# Patient Record
Sex: Female | Born: 1955 | Race: White | Hispanic: No | Marital: Single | Smoking: Former smoker
Health system: Southern US, Community
[De-identification: ages and names within clinical notes are randomized; demographics above are authoritative.]

## PROBLEM LIST (undated history)

## (undated) DIAGNOSIS — R51 Headache: Secondary | ICD-10-CM

## (undated) DIAGNOSIS — J189 Pneumonia, unspecified organism: Secondary | ICD-10-CM

## (undated) DIAGNOSIS — J45909 Unspecified asthma, uncomplicated: Secondary | ICD-10-CM

## (undated) DIAGNOSIS — Z9289 Personal history of other medical treatment: Secondary | ICD-10-CM

## (undated) DIAGNOSIS — Z974 Presence of external hearing-aid: Secondary | ICD-10-CM

## (undated) HISTORY — PX: CATARACT EXTRACTION: SUR2

## (undated) HISTORY — PX: ABDOMINAL HYSTERECTOMY: SHX81

## (undated) HISTORY — DX: Headache: R51

## (undated) HISTORY — DX: Pneumonia, unspecified organism: J18.9

## (undated) HISTORY — DX: Unspecified asthma, uncomplicated: J45.909

## (undated) HISTORY — DX: Presence of external hearing-aid: Z97.4

## (undated) HISTORY — DX: Personal history of other medical treatment: Z92.89

## (undated) HISTORY — PX: OTHER SURGICAL HISTORY: SHX169

---

## 1998-03-20 ENCOUNTER — Emergency Department (HOSPITAL_COMMUNITY): Admission: EM | Admit: 1998-03-20 | Discharge: 1998-03-20 | Payer: Self-pay

## 2000-04-01 ENCOUNTER — Ambulatory Visit (HOSPITAL_COMMUNITY): Admission: RE | Admit: 2000-04-01 | Discharge: 2000-04-01 | Payer: Self-pay | Admitting: *Deleted

## 2000-04-01 ENCOUNTER — Encounter: Payer: Self-pay | Admitting: *Deleted

## 2000-06-08 ENCOUNTER — Inpatient Hospital Stay (HOSPITAL_COMMUNITY): Admission: AD | Admit: 2000-06-08 | Discharge: 2000-06-08 | Payer: Self-pay | Admitting: Obstetrics

## 2000-06-09 ENCOUNTER — Inpatient Hospital Stay (HOSPITAL_COMMUNITY): Admission: AD | Admit: 2000-06-09 | Discharge: 2000-06-09 | Payer: Self-pay | Admitting: *Deleted

## 2000-06-28 ENCOUNTER — Inpatient Hospital Stay (HOSPITAL_COMMUNITY): Admission: AD | Admit: 2000-06-28 | Discharge: 2000-06-28 | Payer: Self-pay | Admitting: *Deleted

## 2000-07-24 ENCOUNTER — Encounter (INDEPENDENT_AMBULATORY_CARE_PROVIDER_SITE_OTHER): Payer: Self-pay | Admitting: Specialist

## 2000-07-24 ENCOUNTER — Inpatient Hospital Stay (HOSPITAL_COMMUNITY): Admission: AD | Admit: 2000-07-24 | Discharge: 2000-07-26 | Payer: Self-pay | Admitting: *Deleted

## 2001-05-17 ENCOUNTER — Emergency Department (HOSPITAL_COMMUNITY): Admission: EM | Admit: 2001-05-17 | Discharge: 2001-05-17 | Payer: Self-pay | Admitting: Emergency Medicine

## 2001-05-17 ENCOUNTER — Encounter: Payer: Self-pay | Admitting: Emergency Medicine

## 2006-08-09 DIAGNOSIS — Z9289 Personal history of other medical treatment: Secondary | ICD-10-CM

## 2006-08-09 HISTORY — DX: Personal history of other medical treatment: Z92.89

## 2006-12-22 ENCOUNTER — Ambulatory Visit: Payer: Self-pay | Admitting: Nurse Practitioner

## 2007-01-11 ENCOUNTER — Encounter (HOSPITAL_COMMUNITY): Admission: RE | Admit: 2007-01-11 | Discharge: 2007-03-20 | Payer: Self-pay | Admitting: Family Medicine

## 2007-01-26 ENCOUNTER — Ambulatory Visit: Payer: Self-pay | Admitting: Family Medicine

## 2007-01-27 ENCOUNTER — Inpatient Hospital Stay (HOSPITAL_COMMUNITY): Admission: AD | Admit: 2007-01-27 | Discharge: 2007-01-27 | Payer: Self-pay | Admitting: Gynecology

## 2007-03-01 ENCOUNTER — Encounter: Payer: Self-pay | Admitting: Obstetrics & Gynecology

## 2007-03-01 ENCOUNTER — Ambulatory Visit: Payer: Self-pay | Admitting: Obstetrics & Gynecology

## 2007-03-20 ENCOUNTER — Ambulatory Visit: Payer: Self-pay | Admitting: Obstetrics & Gynecology

## 2007-03-20 ENCOUNTER — Inpatient Hospital Stay (HOSPITAL_COMMUNITY): Admission: RE | Admit: 2007-03-20 | Discharge: 2007-03-23 | Payer: Self-pay | Admitting: Obstetrics & Gynecology

## 2007-03-20 ENCOUNTER — Encounter: Payer: Self-pay | Admitting: Obstetrics & Gynecology

## 2007-04-28 ENCOUNTER — Ambulatory Visit: Payer: Self-pay | Admitting: Obstetrics & Gynecology

## 2007-05-12 ENCOUNTER — Ambulatory Visit: Payer: Self-pay | Admitting: Obstetrics & Gynecology

## 2009-04-12 ENCOUNTER — Emergency Department (HOSPITAL_COMMUNITY): Admission: EM | Admit: 2009-04-12 | Discharge: 2009-04-12 | Payer: Self-pay | Admitting: Emergency Medicine

## 2010-08-30 ENCOUNTER — Encounter: Payer: Self-pay | Admitting: Family Medicine

## 2010-11-13 LAB — URINALYSIS, ROUTINE W REFLEX MICROSCOPIC
Ketones, ur: NEGATIVE mg/dL
Leukocytes, UA: NEGATIVE
Nitrite: NEGATIVE
Urobilinogen, UA: 1 mg/dL (ref 0.0–1.0)
pH: 7.5 (ref 5.0–8.0)

## 2010-11-13 LAB — URINE MICROSCOPIC-ADD ON

## 2010-12-22 NOTE — Op Note (Signed)
NAMEVINCENZINA, [name]                ACCOUNT NO.:  [id number]   MEDICAL RECORD NO.:  [id number]          PATIENT TYPE:  INP   LOCATION:  9302                          [facility]   PHYSICIAN:  Allie Bossier, MD        DATE OF BIRTH:  11-09-1955   DATE OF PROCEDURE:  03/20/2007  DATE OF DISCHARGE:                               OPERATIVE REPORT   PREOPERATIVE DIAGNOSIS:  Uterine fibroids and anemia.   POSTOPERATIVE DIAGNOSIS:  Uterine fibroids and anemia.   PROCEDURE:  Total abdominal hysterectomy, bilateral salpingo-  oophorectomy.   SURGEON:  Allie Bossier, M.D.   ANESTHESIA:  Oda Cogan, M.D.   COMPLICATIONS:  None.   ESTIMATED BLOOD LOSS:  400 mL.   SPECIMEN:  Uterus, tubes and ovaries.   PROCEDURE AND FINDINGS:  In the preop area the risks, benefits,  alternatives of surgery explained, understood and accepted by patient  and sisters. Consents were signed. In the operating room.  General  anesthesia was applied without complication.  Her abdomen and vagina  were prepped and draped usual sterile fashion.  A Foley catheter drained  clear urine throughout the case. A transverse incision was made  approximately 2 cm above the symphysis pubis.  Incision was carried down  through the subcutaneous tissue and the fascia.  Bleeding was cauterized  with a Bovie.  Fascial incision was extended bilaterally.  The rectus  muscles were partially separated in transverse fashion using  electrosurgical technique.  The peritoneum was entered with hemostats;  peritoneal incision was extended bilaterally taking care to avoid the  bladder.  The bowel was packed up into the abdominal cavity with a moist  sponge. Abdominal sidewalls were lined with the moist sponges and an  O'Connor-O'Sullivan self-retaining retractor was placed. The enlarged  uterus was elevated with Kocher clamps on the utero-ovarian ligaments.  The round ligaments were clamped, cut and ligated and then the utero-  ovarian  ligaments were clamped and ligated. 2-0 Vicryl sutures used  throughout this case unless otherwise specified.  A bladder flap was  created anteriorly.  The bladder was pushed out of the operative site.  The uterine vessels were skeletonized, clamped, cut and doubly ligated.  By lifting the uterus out of the incisions we were able to visualize all  the anatomy and remove the cervix and uterus together with a clamp, cut  and ligate technique.  The cervix was removed and noted to be intact.  The vaginal cuff was closed with angle sutures of 0 Vicryl and then  running locking closure of the vaginal cuff.  Excellent hemostasis was  noted.  The pelvis was irrigated.  The ureters were visualized.  They  were noted to be of normal caliber and functioning normally.  The  infundibulopelvic ligaments were identified, clamped, cut and doubly  ligated.  Excellent hemostasis was noted of all pedicles.  The sponges  and retractor were removed.  The fascia was closed with 0 Vicryl running  nonlocking suture and no defects were palpable.  The subcutaneous tissue  was irrigated cleaned and dried. It  was then infiltrated with 30 mL of 0.5% Marcaine.  A subcuticular  closure was done with 3-0 Vicryl, subcutaneous tissue was irrigated  prior to closure. Her Foley catheter drained clear urine throughout.  She was extubated and taken to recovery in stable condition with  instrument, sponge and needle counts correct.      Allie Bossier, MD  Electronically Signed     MCD/MEDQ  D:  03/20/2007  T:  03/20/2007  Job:  [id number]

## 2010-12-22 NOTE — Group Therapy Note (Signed)
[name], [name]                ACCOUNT NO.:  [id number]   MEDICAL RECORD NO.:  [id number]          PATIENT TYPE:  WOC   LOCATION:  [facility]                   FAC[facility]:  [facility]   PHYSICIAN:  Elsie Lincoln, MD      DATE OF BIRTH:  1955-12-27   DATE OF SERVICE:                                  CLINIC NOTE   REASON FOR VISIT/CHIEF COMPLAINT:  The patient is here for a  postoperative checkup for an abdominal hysterectomy, bilateral salpingo-  oophorectomy done for uterine fibroids and anemia on March 20, 2007.  It has been almost 6 weeks, but not quite, since her surgery.  She  denies any bleeding, no problems urinating, a slight bit of  constipation.  Denies any pain.  She has been using ibuprofen, but not  for abdominal pain, she says just when she has a pain somewhere else.  Her pathology results were completely benign, just showed leiomyomata.   PHYSICAL EXAMINATION:  Temperature was 97.7, pulse 60, blood pressure  134/80, weight 144.7 pounds.  ABDOMEN:  Soft, nondistended, nontender.  Scar transverse and well  healed from her total abdominal hysterectomy.  GENITAL EXAM:  Revealed normal labia, normal vaginal introitus, normal  vaginal walls.  A small amount of yellowish-whitish discharge along  suture lines from the hysterectomy inside of her vagina.  Also, some  friable, erythematous areas near the incision line.  No malodorous  discharge.  Bimanual exam showed no masses or adnexal tenderness, no  cervical motion tenderness, or tenderness where her cervix used to be.   ASSESSMENT AND PLAN:  The patient is a woman in her 18s, status post  hysterectomy and oophorectomy for fibroids and anemia, benign pathology.  The patient will follow up in 2 weeks to recheck her vaginal area due to  the friableness and the slight bit of discharge.  It appears that her  hysterectomy site has not healed completely.  I advised the patient to  not have anything per vagina, no douches, no  intercourse for the next 2  weeks until we examine her again.  She may go back to work, however.  Also gave her a prescription for Colace, she can get this over the  counter 100 mg 1 tablet p.o. daily for her constipation.  Dr. Penne Lash  did examine the patient's vagina, did the pelvic exam with me and  everything she mentioned was stated in the physical exam portion of this  dictation.     ______________________________  Elsie Lincoln, MD    ______________________________  Elsie Lincoln, MD    KL/MEDQ  D:  04/28/2007  T:  04/28/2007  Job:  [id number]

## 2010-12-22 NOTE — Discharge Summary (Signed)
[name], [name]                ACCOUNT NO.:  [id number]   MEDICAL RECORD NO.:  [id number]          PATIENT TYPE:  INP   LOCATION:  9302                          [facility]   PHYSICIAN:  Allie Bossier, MD        DATE OF BIRTH:  11-19-1955   DATE OF ADMISSION:  03/20/2007  DATE OF DISCHARGE:  03/23/2007                               DISCHARGE SUMMARY   DISPOSITION:  Home.  Diet as tolerated.  Activity would be pelvic rest,  please continue medications of Darvocet N 100 1 p.o. q.4h p.r.n. pain   PROCEDURE:  During hospital stay was a total abdominal hysterectomy,  bilateral salpingo-oophorectomy and a transfusion of 2 units of packed  red blood cells.   HISTORY OF PRESENT ILLNESS AND HOSPITAL COURSE:  [name] is a 55 year old  single white gravida 1, para 1 who was seen as a referral from  [facility] for anemia and dysfunctional uterine bleeding.  An  ultrasound had been done which showed an 8 cm fibroid with a submucosal  component.  Preoperatively I rechecked her hemoglobin and hematocrit and  it was 11.6 and 35.3.  Therefore I did not transfuse her preoperatively.  She had an uncomplicated surgery an EBL of approximately 400.  However  on postoperative day #1 her hemoglobin was 7.7 and I transfused her 2  units of packed red blood cells.  Follow-up hemoglobin on postop day 2  was 8.9 and it was stable on the day of discharge at 8.7.  Postoperatively she did very well.  She was voiding, ambulating and  tolerating p.o. intake without difficulty.  She remained afebrile  throughout her hospital course and was ready to go home by the day of  discharge.  Pathology results showed benign proliferative endometrium  submucosal and subserosal fibroids and normal ovaries.      Allie Bossier, MD  Electronically Signed     MCD/MEDQ  D:  04/21/2007  T:  04/22/2007  Job:  [id number]

## 2010-12-22 NOTE — Group Therapy Note (Signed)
NAME:  [name], HOLIK NO.:  [id number]   MEDICAL RECORD NO.:  [id number]          PATIENT TYPE:  WOC   LOCATION:  [facility]                   [facility]   PHYSICIAN:  Allie Bossier, MD        DATE OF BIRTH:  [birth year]   DATE OF SERVICE:                                  CLINIC NOTE   Azalynn is a 55 year old single white gravida 1, para 1 with a 6-1/2 year  old daughter named Publishing rights manager.  She comes here for her annual exam.  Her  last Pap smear was done approximately 6 years ago and she has never had  a mammogram.  Her major complaint is that of heavy vaginal bleeding.  She was in fact, seen at the [facility] January 27, 2007 for heavy vaginal  bleeding.  Actually, the day before at [facility] her hemoglobin  was  noted to be 7.3.  An ultrasound was done which showed an 8 cm fibroid  centrally located with a submucosal component.  Her ovaries appeared  normal and her uterine lining was not visualized.   PAST MEDICAL HISTORY:  Bilateral cataracts, anemia, fibroid and  menorrhagia.   PAST SURGICAL HISTORY:  Tubal ligation and right cataract removal.   No known drug allergies.   MEDICATIONS:  She takes iron daily and ibuprofen as necessary.   SOCIAL HISTORY:  Negative for alcohol.  She was a previous social smoker  but quit entirely 4 years ago.  No known drug allergies, no latex  allergies.   FAMILY HISTORY:  Significant for breast cancer in her mother in her 58s.   PHYSICAL EXAMINATION:  Weight 140 pounds.  Height 5 feet 10 inches.  Blood pressure 145/89, pulse 63, normal temperature.  HEENT:  Normal with the exception of poor dentition and apparently a  left cataract.  BREAST:  Exam normal.  ABDOMINAL:  Her uterus is palpable through approximately 4 cm below the  umbilicus.  LUNGS:  Clear to auscultation bilaterally.  HEART:  Regular rate and rhythm.  BACK:  Does seem to demonstrate some osteoporotic changes and she is  certainly thin.  EXTERNAL GENITALIA:   Mild atrophy.  Normal cervix and her uterus is 14  to 16 weeks size.   ASSESSMENT AND PLAN:  Annual exam.  I have checked a Pap smear.  With  regard to her fibroid and anemia I will also check a TSH to make sure  this is not an issue.  With regard to her fibroid I have counseled her  regarding  options.  We have discussed Lupron which is financially not an option  versus a hysterectomy and removal of her ovaries.  She is amendable to  this and I will schedule it.  I will most likely transfuse her prior to  surgery.      Allie Bossier, MD     MCD/MEDQ  D:  03/01/2007  T:  03/02/2007  Job:  [id number]

## 2010-12-25 NOTE — Op Note (Signed)
[facility]  Patient:    [name], [name]                       MRN: [id number] Proc. Date: 07/25/00 Adm. Date:  [id number] Attending:  Michaelle Copas Dictator:   Jamey Reas, M.D.                           Operative Report  DATE OF BIRTH:                05-22-1956  PREOPERATIVE DIAGNOSES:       Advanced maternal age, postpartum, desires permanent sterilization.  POSTOPERATIVE DIAGNOSES:      Advanced maternal age, postpartum, desires permanent sterilization.  PROCEDURE:                    Postpartum tubal ligation, Pomeroy method.  SURGEON:                      Roseanna Rainbow, M.D.  ASSIST:                       Jamey Reas, M.D.  ANESTHESIA:                   Epidural.  COMPLICATIONS:                None.  ESTIMATED BLOOD LOSS:         Minimal.  FLUIDS:                       Lactated Ringers 200 cc.  INDICATIONS:                  A 55 year old G1, P1-0-0-1 status post NSVD who desires permanent sterilization.  Risks and benefits of procedure discussed with patient including risk failure of 3-5 per 1000 with increased risk of ectopic gestation if pregnancy occurs.  Also discussed that patient is getting towards the end of her ovulatory life and she desires to persist on for tubal ligation.  FINDINGS:                     Normal tubes and ovaries.  Patient did have evidence of fibroids on examination of the uterus.  PROCEDURE:                    The patient was taken to the operating room where epidural was found to be adequate.  A small transverse infraumbilical skin incision was then made with the scalpel.  The incision was carried down to the underlying fascia until the peritoneum was identified and entered.  The peritoneum was noted to be free of any adhesions and the incision was then extended with Metzenbaum scissors.  The patients right fallopian tube was then identified, brought to the  incision, and grasped with Babcock clamp.  The tube was then followed out to the fimbria.  Babcock clamp was then used to grasp the tube approximately 4 cm from the corneal region.  A 3 cm segment of tube was then ligated with a free tie of plain gut and a second plain gut tie was placed.  The tube was then excised.  Good hemostasis was noted and the tube was returned to the abdomen.  The left fallopian tube was then ligated and a 3  cm segment excised in a similar fashion.  Excellent hemostasis was noted and the tube returned to the abdomen.  Peritoneum and fascia were then closed in a single layer using 3-0 Vicryl suture.  The skin was closed in a subcuticular fashion with 4-0 Vicryl.  Patient tolerated procedure well. Sponge, lap, and needle counts were correct x 2.  Patient was taken to the recovery room in stable condition.  PATHOLOGY:                    Segments of right and left fallopian tubes.DD: 07/25/00 TD:  07/25/00 Job: [id number]

## 2011-02-21 ENCOUNTER — Emergency Department (HOSPITAL_COMMUNITY)
Admission: EM | Admit: 2011-02-21 | Discharge: 2011-02-21 | Disposition: A | Discharge status: Home / Self Care | Payer: 59 | Attending: Emergency Medicine | Admitting: Emergency Medicine

## 2011-02-21 DIAGNOSIS — L2989 Other pruritus: Secondary | ICD-10-CM | POA: Insufficient documentation

## 2011-02-21 DIAGNOSIS — L298 Other pruritus: Secondary | ICD-10-CM | POA: Insufficient documentation

## 2011-02-21 DIAGNOSIS — L259 Unspecified contact dermatitis, unspecified cause: Secondary | ICD-10-CM | POA: Insufficient documentation

## 2011-03-29 ENCOUNTER — Emergency Department (HOSPITAL_COMMUNITY)
Admission: EM | Admit: 2011-03-29 | Discharge: 2011-03-29 | Disposition: A | Discharge status: Home / Self Care | Payer: 59 | Attending: Emergency Medicine | Admitting: Emergency Medicine

## 2011-03-29 DIAGNOSIS — M79609 Pain in unspecified limb: Secondary | ICD-10-CM | POA: Insufficient documentation

## 2011-03-29 DIAGNOSIS — L02419 Cutaneous abscess of limb, unspecified: Secondary | ICD-10-CM | POA: Insufficient documentation

## 2011-03-31 ENCOUNTER — Emergency Department (HOSPITAL_COMMUNITY)
Admission: EM | Admit: 2011-03-31 | Discharge: 2011-03-31 | Disposition: A | Discharge status: Home / Self Care | Payer: 59 | Attending: Emergency Medicine | Admitting: Emergency Medicine

## 2011-03-31 DIAGNOSIS — Z4800 Encounter for change or removal of nonsurgical wound dressing: Secondary | ICD-10-CM | POA: Insufficient documentation

## 2011-03-31 DIAGNOSIS — Z09 Encounter for follow-up examination after completed treatment for conditions other than malignant neoplasm: Secondary | ICD-10-CM | POA: Insufficient documentation

## 2011-03-31 DIAGNOSIS — L02419 Cutaneous abscess of limb, unspecified: Secondary | ICD-10-CM | POA: Insufficient documentation

## 2011-03-31 DIAGNOSIS — L03119 Cellulitis of unspecified part of limb: Secondary | ICD-10-CM | POA: Insufficient documentation

## 2011-04-02 ENCOUNTER — Emergency Department (HOSPITAL_COMMUNITY)
Admission: EM | Admit: 2011-04-02 | Discharge: 2011-04-02 | Disposition: A | Discharge status: Home / Self Care | Payer: 59 | Attending: Emergency Medicine | Admitting: Emergency Medicine

## 2011-04-02 DIAGNOSIS — Z09 Encounter for follow-up examination after completed treatment for conditions other than malignant neoplasm: Secondary | ICD-10-CM | POA: Insufficient documentation

## 2011-04-02 DIAGNOSIS — L02419 Cutaneous abscess of limb, unspecified: Secondary | ICD-10-CM | POA: Insufficient documentation

## 2011-05-24 LAB — CROSSMATCH

## 2011-05-24 LAB — CBC
HCT: 22.4 — ABNORMAL LOW
HCT: 26.2 — ABNORMAL LOW
HCT: 26.6 — ABNORMAL LOW
HCT: 35.3 — ABNORMAL LOW
Hemoglobin: 11.6 — ABNORMAL LOW
Hemoglobin: 7.7 — CL
MCHC: 32.8
MCHC: 33.5
MCV: 84.3
MCV: 85.3
MCV: 85.9
MCV: 87.4
Platelets: 187
Platelets: 187
Platelets: 240
RBC: 3.1 — ABNORMAL LOW
RBC: 4.14
RDW: 26.4 — ABNORMAL HIGH
RDW: 28.4 — ABNORMAL HIGH
WBC: 5.7

## 2011-05-24 LAB — BASIC METABOLIC PANEL
CO2: 30
Chloride: 102
Creatinine, Ser: 0.51
GFR calc Af Amer: >60
Potassium: 3.7
Sodium: 138

## 2011-05-26 LAB — WET PREP, GENITAL: Yeast Wet Prep HPF POC: NONE SEEN

## 2011-05-27 LAB — CROSSMATCH
ABO/RH(D): A POS
Antibody Screen: NEGATIVE

## 2011-05-27 LAB — ABO/RH: ABO/RH(D): A POS

## 2012-01-28 ENCOUNTER — Emergency Department (HOSPITAL_COMMUNITY)
Admission: EM | Admit: 2012-01-28 | Discharge: 2012-01-28 | Disposition: A | Discharge status: Home / Self Care | Payer: 59 | Attending: Emergency Medicine | Admitting: Emergency Medicine

## 2012-01-28 ENCOUNTER — Encounter (HOSPITAL_COMMUNITY): Payer: Self-pay | Admitting: Emergency Medicine

## 2012-01-28 DIAGNOSIS — M25519 Pain in unspecified shoulder: Secondary | ICD-10-CM | POA: Insufficient documentation

## 2012-01-28 MED ORDER — ACETAMINOPHEN-CODEINE #3 300-30 MG PO TABS: 1.0000 | ORAL_TABLET | Freq: Four times a day (QID) | ORAL | Status: AC | PRN

## 2012-01-28 MED ORDER — NAPROXEN 500 MG PO TABS: 500.0000 mg | ORAL_TABLET | Freq: Two times a day (BID) | ORAL | Status: DC

## 2012-01-28 NOTE — ED Provider Notes (Signed)
History     CSN: [id number]  Arrival date & time 01/28/12  1025   First MD Initiated Contact with Patient 01/28/12 1128      Chief Complaint  Patient presents with  . Shoulder Pain    (Consider location/radiation/quality/duration/timing/severity/associated sxs/prior treatment) Patient is a 56 y.o. female presenting with shoulder pain. The history is provided by the patient.  Shoulder Pain  Patient presents to ER complaining of a 2 week hx of intermittent left shoulder pain stating that a couple of weeks ago she noticed pain in shoulder with movement that she took aleve for and it went away but then once again a few days ago started noticing pain in left shoulder with movement and has been taking both aleve and ibuprofen for pain but with ongoing pain. Notes some temporary improvement in pain but then recurrence. 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 She states she noticed some swelling in her hand a few days ago but then the swelling has completely resolved. Denies any other skin changes or redness of joints. Does not have a PCP and takes no meds on regular basis.   History reviewed. No pertinent past medical history.  History reviewed. No pertinent past surgical history.  History reviewed. No pertinent family history.  History  Substance Use Topics  . Smoking status: Never Smoker   . Smokeless tobacco: Not on file  . Alcohol Use: Yes     occasional    OB History    Grav Para Term Preterm Abortions TAB SAB Ect Mult Living                  Review of Systems  All other systems reviewed and are negative.    Allergies  Zithromax  Home Medications   Current Outpatient Rx  Name Route Sig Dispense Refill  . IBUPROFEN 200 MG PO TABS Oral Take 600 mg by mouth every 6 (six) hours as needed. For pain    . NAPROXEN SODIUM 220 MG PO TABS Oral Take 440 mg by mouth 2 (two) times daily as needed. For pain       BP 142/83  Pulse 61  Temp 98.1 F (36.7 C) (Oral)  Resp 18  SpO2 100%  Physical Exam  Constitutional: She is oriented to person, place, and time. She appears well-developed and well-nourished. No distress.  HENT:  Head: Normocephalic and atraumatic.  Eyes: Conjunctivae are normal.  Cardiovascular: Normal rate, regular rhythm, normal heart sounds and intact distal pulses.  Exam reveals no gallop and no friction rub.   No murmur heard. Pulmonary/Chest: Effort normal and breath sounds normal. No respiratory distress. She has no wheezes. She has no rales. She exhibits no tenderness.  Musculoskeletal: She exhibits tenderness. She exhibits no edema.       Right ankle: She exhibits swelling. tenderness.       TTP of left humeral head and decreased ROM due to pain. No shoulder deformity, redness, skin changes or heat. No TTP of posterior shoulder. Pain with abductions and rotation. Good radial pulse and sensation of entire LUE.  Neurological: She is alert and oriented to person, place, and time.       Normal sensation of entire foot.   Skin: Skin is warm and dry. No rash noted. She is not diaphoretic. No erythema. No pallor.  Psychiatric: She has a normal mood and affect. Her behavior is normal.    ED Course  Procedures (including critical care time)  Patient drove self to ER and took ibuprofen this morning.   Labs Reviewed - No data to display No results found.   1. Shoulder pain       MDM  Left shoulder pain with no evidence of septic joint or infection. LUE is neurovasc intact. No known injury but repetitive movement for work likely MSK pain/inflammation. Will treat with NSAIDs, ice and pain medication and give ortho follow up.   There is no swelling or skin changes to entire LUE.         [place], [place] 01/28/12 1145

## 2012-01-28 NOTE — ED Notes (Signed)
Pt c/o left shoulder pain x several weeks with swelling to 2nd digit on left hand; pt denies obvious injury; CMS intact

## 2012-01-28 NOTE — ED Provider Notes (Signed)
Medical screening examination/treatment/procedure(s) were performed by non-physician practitioner and as supervising physician I was immediately available for consultation/collaboration.  Barbar Brede, MD 01/28/12 1426 

## 2012-01-28 NOTE — Discharge Instructions (Signed)
Take naprosyn as directed for inflammation and pain using tylenol #3 for breakthrough pain but do not drive or operate machinery with tylenol #3 use. Ice shoulder in the mornings and evenings for additional pain relief. Follow up with orthopedic specialist in 1-2 weeks for recheck of ongoing shoulder pain but return to ER for any emergent changing or worsening of symptoms. Establishment with a Primary Care provider is Very important for general health care concerns, minor illness and minor injury. See the attached referral to primary care provider.    Arthralgia Your caregiver has diagnosed you as suffering from an arthralgia. Arthralgia means there is pain in a joint. This can come from many reasons including:  Bruising the joint which causes soreness (inflammation) in the joint.   Wear and tear on the joints which occur as we grow older (osteoarthritis).   Overusing the joint.   Various forms of arthritis.   Infections of the joint.  Regardless of the cause of pain in your joint, most of these different pains respond to anti-inflammatory drugs and rest. The exception to this is when a joint is infected, and these cases are treated with antibiotics, if it is a bacterial infection. HOME CARE INSTRUCTIONS   Rest the injured area for as long as directed by your caregiver. Then slowly start using the joint as directed by your caregiver and as the pain allows. Crutches as directed may be useful if the ankles, knees or hips are involved. If the knee was splinted or casted, continue use and care as directed. If an stretchy or elastic wrapping bandage has been applied today, it should be removed and re-applied every 3 to 4 hours. It should not be applied tightly, but firmly enough to keep swelling down. Watch toes and feet for swelling, bluish discoloration, coldness, numbness or excessive pain. If any of these problems (symptoms) occur, remove the ace bandage and re-apply more loosely. If these symptoms  persist, contact your caregiver or return to this location.   For the first 24 hours, keep the injured extremity elevated on pillows while lying down.   Apply ice for 15 to 20 minutes to the sore joint every couple hours while awake for the first half day. Then 3 to 4 times per day for the first 48 hours. Put the ice in a plastic bag and place a towel between the bag of ice and your skin.   Wear any splinting, casting, elastic bandage applications, or slings as instructed.   Only take over-the-counter or prescription medicines for pain, discomfort, or fever as directed by your caregiver. Do not use aspirin immediately after the injury unless instructed by your physician. Aspirin can cause increased bleeding and bruising of the tissues.   If you were given crutches, continue to use them as instructed and do not resume weight bearing on the sore joint until instructed.  Persistent pain and inability to use the sore joint as directed for more than 2 to 3 days are warning signs indicating that you should see a caregiver for a follow-up visit as soon as possible. Initially, a hairline fracture (break in bone) may not be evident on X-rays. Persistent pain and swelling indicate that further evaluation, non-weight bearing or use of the joint (use of crutches or slings as instructed), or further X-rays are indicated. X-rays may sometimes not show a small fracture until a week or 10 days later. Make a follow-up appointment with your own caregiver or one to whom we have referred you. A  radiologist (specialist in reading X-rays) may read your X-rays. Make sure you know how you are to obtain your X-ray results. Do not assume everything is normal if you do not hear from [place]. SEEK MEDICAL CARE IF: Bruising, swelling, or pain increases. SEEK IMMEDIATE MEDICAL CARE IF:   Your fingers or toes are numb or blue.   The pain is not responding to medications and continues to stay the same or get worse.   The pain in  your joint becomes severe.   You develop a fever over 102 F (38.9 C).   It becomes impossible to move or use the joint.  MAKE SURE YOU:   Understand these instructions.   Will watch your condition.   Will get help right away if you are not doing well or get worse.  Document Released: 07/26/2005 Document Revised: 07/15/2011 Document Reviewed: 03/13/2008 [facility] Patient Information 2012 [place], [place].

## 2012-07-09 DIAGNOSIS — J189 Pneumonia, unspecified organism: Secondary | ICD-10-CM

## 2012-07-09 HISTORY — DX: Pneumonia, unspecified organism: J18.9

## 2012-08-05 ENCOUNTER — Encounter (HOSPITAL_COMMUNITY): Payer: Self-pay | Admitting: *Deleted

## 2012-08-05 ENCOUNTER — Emergency Department (HOSPITAL_COMMUNITY)
Admission: EM | Admit: 2012-08-05 | Discharge: 2012-08-05 | Disposition: A | Discharge status: Home / Self Care | Payer: 59 | Attending: Emergency Medicine | Admitting: Emergency Medicine

## 2012-08-05 ENCOUNTER — Other Ambulatory Visit: Payer: Self-pay

## 2012-08-05 ENCOUNTER — Emergency Department (HOSPITAL_COMMUNITY): Payer: 59

## 2012-08-05 DIAGNOSIS — I4891 Unspecified atrial fibrillation: Secondary | ICD-10-CM | POA: Insufficient documentation

## 2012-08-05 DIAGNOSIS — J9801 Acute bronchospasm: Secondary | ICD-10-CM | POA: Insufficient documentation

## 2012-08-05 DIAGNOSIS — R062 Wheezing: Secondary | ICD-10-CM | POA: Insufficient documentation

## 2012-08-05 DIAGNOSIS — Z90710 Acquired absence of both cervix and uterus: Secondary | ICD-10-CM | POA: Insufficient documentation

## 2012-08-05 DIAGNOSIS — J159 Unspecified bacterial pneumonia: Secondary | ICD-10-CM | POA: Insufficient documentation

## 2012-08-05 DIAGNOSIS — J189 Pneumonia, unspecified organism: Secondary | ICD-10-CM

## 2012-08-05 DIAGNOSIS — Z7982 Long term (current) use of aspirin: Secondary | ICD-10-CM | POA: Insufficient documentation

## 2012-08-05 DIAGNOSIS — R05 Cough: Secondary | ICD-10-CM | POA: Insufficient documentation

## 2012-08-05 DIAGNOSIS — R059 Cough, unspecified: Secondary | ICD-10-CM | POA: Insufficient documentation

## 2012-08-05 DIAGNOSIS — J45909 Unspecified asthma, uncomplicated: Secondary | ICD-10-CM | POA: Insufficient documentation

## 2012-08-05 DIAGNOSIS — Z87891 Personal history of nicotine dependence: Secondary | ICD-10-CM | POA: Insufficient documentation

## 2012-08-05 DIAGNOSIS — I509 Heart failure, unspecified: Secondary | ICD-10-CM | POA: Insufficient documentation

## 2012-08-05 LAB — URINALYSIS, ROUTINE W REFLEX MICROSCOPIC
Ketones, ur: NEGATIVE mg/dL
Leukocytes, UA: NEGATIVE
Nitrite: NEGATIVE
Specific Gravity, Urine: 1.007 (ref 1.005–1.030)
Urobilinogen, UA: 0.2 mg/dL (ref 0.0–1.0)
pH: 7 (ref 5.0–8.0)

## 2012-08-05 LAB — POCT I-STAT TROPONIN I: Troponin i, poc: 0 ng/mL (ref 0.00–0.08)

## 2012-08-05 LAB — COMPREHENSIVE METABOLIC PANEL
Albumin: 3.2 g/dL — ABNORMAL LOW (ref 3.5–5.2)
BUN: 11 mg/dL (ref 6–23)
Calcium: 9.8 mg/dL (ref 8.4–10.5)
Creatinine, Ser: 0.46 mg/dL — ABNORMAL LOW (ref 0.50–1.10)
Potassium: 3.6 mEq/L (ref 3.5–5.1)
Total Protein: 7.7 g/dL (ref 6.0–8.3)

## 2012-08-05 LAB — CBC WITH DIFFERENTIAL/PLATELET
Basophils Relative: 0 % (ref 0–1)
Eosinophils Absolute: 0.1 10*3/uL (ref 0.0–0.7)
Hemoglobin: 11.4 g/dL — ABNORMAL LOW (ref 12.0–15.0)
MCH: 27.8 pg (ref 26.0–34.0)
MCHC: 32.2 g/dL (ref 30.0–36.0)
Monocytes Absolute: 1.1 10*3/uL — ABNORMAL HIGH (ref 0.1–1.0)
Monocytes Relative: 9 % (ref 3–12)
Neutrophils Relative %: 78 % — ABNORMAL HIGH (ref 43–77)

## 2012-08-05 LAB — CG4 I-STAT (LACTIC ACID): Lactic Acid, Venous: 1.14 mmol/L (ref 0.5–2.2)

## 2012-08-05 LAB — PROTIME-INR
INR: 1.19 (ref 0.00–1.49)
Prothrombin Time: 14.9 seconds (ref 11.6–15.2)

## 2012-08-05 MED ORDER — METHYLPREDNISOLONE SODIUM SUCC 125 MG IJ SOLR
125.0000 mg | Freq: Once | INTRAMUSCULAR | Status: AC
  Administered 2012-08-05: 125 mg via INTRAVENOUS
  Filled 2012-08-05: qty 2

## 2012-08-05 MED ORDER — DILTIAZEM HCL ER COATED BEADS 120 MG PO CP24: 120.0000 mg | ORAL_CAPSULE | Freq: Every day | ORAL | Status: DC

## 2012-08-05 MED ORDER — ASPIRIN EC 325 MG PO TBEC: 325.0000 mg | DELAYED_RELEASE_TABLET | Freq: Every day | ORAL | Status: AC

## 2012-08-05 MED ORDER — AZITHROMYCIN 250 MG PO TABS: ORAL_TABLET | ORAL | Status: DC

## 2012-08-05 MED ORDER — DEXTROSE 5 % IV SOLN
5.0000 mg/h | INTRAVENOUS | Status: DC
  Administered 2012-08-05: 10 mg/h via INTRAVENOUS
  Administered 2012-08-05 (×2): 15 mg/h via INTRAVENOUS
  Administered 2012-08-05: 5 mg/h via INTRAVENOUS

## 2012-08-05 MED ORDER — ALBUTEROL SULFATE HFA 108 (90 BASE) MCG/ACT IN AERS: 2.0000 | INHALATION_SPRAY | RESPIRATORY_TRACT | Status: DC | PRN

## 2012-08-05 MED ORDER — SODIUM CHLORIDE 0.9 % IV SOLN
INTRAVENOUS | Status: DC
  Administered 2012-08-05: 14:00:00 via INTRAVENOUS

## 2012-08-05 MED ORDER — DILTIAZEM HCL 25 MG/5ML IV SOLN
20.0000 mg | Freq: Once | INTRAVENOUS | Status: AC
  Administered 2012-08-05: 20 mg via INTRAVENOUS

## 2012-08-05 MED ORDER — DILTIAZEM HCL 50 MG/10ML IV SOLN
10.0000 mg | Freq: Once | INTRAVENOUS | Status: AC
  Administered 2012-08-05: 10 mg via INTRAVENOUS

## 2012-08-05 MED ORDER — IPRATROPIUM BROMIDE 0.02 % IN SOLN
0.5000 mg | Freq: Once | RESPIRATORY_TRACT | Status: AC
  Administered 2012-08-05: 0.5 mg via RESPIRATORY_TRACT
  Filled 2012-08-05: qty 2.5

## 2012-08-05 MED ORDER — SODIUM CHLORIDE 0.9 % IV BOLUS (SEPSIS)
2000.0000 mL | Freq: Once | INTRAVENOUS | Status: AC
  Administered 2012-08-05: 2000 mL via INTRAVENOUS

## 2012-08-05 MED ORDER — ALBUTEROL SULFATE (5 MG/ML) 0.5% IN NEBU
5.0000 mg | INHALATION_SOLUTION | Freq: Once | RESPIRATORY_TRACT | Status: AC
  Administered 2012-08-05: 5 mg via RESPIRATORY_TRACT
  Filled 2012-08-05: qty 1

## 2012-08-05 MED ORDER — PREDNISONE 20 MG PO TABS: ORAL_TABLET | ORAL | Status: DC

## 2012-08-05 MED ORDER — FUROSEMIDE 20 MG PO TABS: 20.0000 mg | ORAL_TABLET | Freq: Two times a day (BID) | ORAL | Status: DC

## 2012-08-05 NOTE — ED Notes (Signed)
Patient reports she has a cough, sob with wheezing.  Patient is sob with short walk.  She states she has hot/cold flashes.  Patient states she has had increasing sob/cough for 10 days.

## 2012-08-05 NOTE — ED Notes (Signed)
ermd aware of elevated heartrate.

## 2012-08-05 NOTE — ED Provider Notes (Signed)
History     CSN: [id number]  Arrival date & time 08/05/12  1217   First MD Initiated Contact with Patient 08/05/12 1239      Chief Complaint  Patient presents with  . Cough  . Shortness of Breath    (Consider location/radiation/quality/duration/timing/severity/associated sxs/prior treatment) HPI This 56 year old female presents with a cough for the last one to 2 weeks with wheezing and occasional shortness breath mild at times, she is no chest pain no fever no confusion no rash no abdominal pain no vomiting no body aches no treatment prior to arrival. She is a former smoker who was diagnosed with asthma in the past but has not used an inhaler for years. She just figured she has a flulike illness and does not want to stay in the hospital but wanted to get checked out before she picks her daughter up from daycare today. She is no chest pain palpitations or lightheadedness. History reviewed. No pertinent past medical history.  Past Surgical History  Procedure Date  . Abdominal hysterectomy     No family history on file.  History  Substance Use Topics  . Smoking status: Never Smoker   . Smokeless tobacco: Not on file  . Alcohol Use: Yes     Comment: occasional    OB History    Grav Para Term Preterm Abortions TAB SAB Ect Mult Living                  Review of Systems 10 Systems reviewed and are negative for acute change except as noted in the HPI. Allergies  Sumycin  Home Medications   Current Outpatient Rx  Name  Route  Sig  Dispense  Refill  . DEXTROMETHORPHAN POLISTIREX ER 30 MG/5ML PO LQCR   Oral   Take 60 mg by mouth as needed. For cough         . IBUPROFEN 200 MG PO TABS   Oral   Take 600 mg by mouth every 6 (six) hours as needed. For pain         . ALBUTEROL SULFATE HFA 108 (90 BASE) MCG/ACT IN AERS   Inhalation   Inhale 2 puffs into the lungs every 2 (two) hours as needed for wheezing or shortness of breath (cough).   1 Inhaler   0   . ASPIRIN  EC 325 MG PO TBEC   Oral   Take 1 tablet (325 mg total) by mouth daily.   30 tablet   0   . AZITHROMYCIN 250 MG PO TABS      2 po day one, then 1 daily x 4 days   5 tablet   0   . DILTIAZEM HCL ER COATED BEADS 120 MG PO CP24   Oral   Take 1 capsule (120 mg total) by mouth daily.   10 capsule   0   . FUROSEMIDE 20 MG PO TABS   Oral   Take 1 tablet (20 mg total) by mouth 2 (two) times daily.   6 tablet   0   . PREDNISONE 20 MG PO TABS      3 tabs po day one, then 2 po daily x 4 days   11 tablet   0     BP 109/67  Pulse 111  Temp 98.6 F (37 C) (Oral)  Resp 23  Ht 5\' 10"  (1.778 m)  Wt 150 lb (68.04 kg)  BMI 21.52 kg/m2  SpO2 93%  Physical Exam  Nursing note and vitals  reviewed. Constitutional:       Awake, alert, nontoxic appearance.  HENT:  Head: Atraumatic.  Eyes: Right eye exhibits no discharge. Left eye exhibits no discharge.  Neck: Neck supple.  Cardiovascular:  No murmur heard.      Tachycardic irregularly irregular rhythm  Pulmonary/Chest: She is in respiratory distress. She has wheezes. She has no rales. She exhibits no tenderness.       Mild diffuse expiratory wheezes no crackles no rhonchi no retractions no excess or muscle usage, the patient speaks full since his mild respiratory distress at rest with tachypnea and low normal pulse oximetry at 92% in room air  Abdominal: Soft. Bowel sounds are normal. She exhibits no distension and no mass. There is no tenderness. There is no rebound and no guarding.  Musculoskeletal: She exhibits no edema and no tenderness.       Baseline ROM, no obvious new focal weakness.  Neurological:       Mental status and motor strength appears baseline for patient and situation.  Skin: No rash noted.  Psychiatric: She has a normal mood and affect.    ED Course  Procedures (including critical care time) ECG: Neuro complex tachycardia with irregular rhythm, suspect atrial flutter with variable conduction, right axis  deviation, nonspecific inferolateral ST depression, no comparison ECG available  Rate improved to 100-110, offered admit again but Pt remains adamant she wants discharge, RA sat 92% at recheck, d/w Cards for close f/u.  CRITICAL CARE Performed by: Hurman Horn   Total critical care time:  Critical care time was exclusive of separately billable procedures and treating other patients.  Critical care was necessary to treat or prevent imminent or life-threatening deterioration.  Critical care was time spent personally by me on the following activities: development of treatment plan with patient and/or surrogate as well as nursing, discussions with consultants, evaluation of patient's response to treatment, examination of patient, obtaining history from patient or surrogate, ordering and performing treatments and interventions, ordering and review of laboratory studies, ordering and review of radiographic studies, pulse oximetry and re-evaluation of patient's condition.  Labs Reviewed  PRO B NATRIURETIC PEPTIDE - Abnormal; Notable for the following:    Pro B Natriuretic peptide (BNP) 804.8 (*)     All other components within normal limits  CBC WITH DIFFERENTIAL - Abnormal; Notable for the following:    WBC 11.9 (*)     Hemoglobin 11.4 (*)     HCT 35.4 (*)     Neutrophils Relative 78 (*)     Neutro Abs 9.3 (*)     Monocytes Absolute 1.1 (*)     All other components within normal limits  COMPREHENSIVE METABOLIC PANEL - Abnormal; Notable for the following:    Glucose, Bld 132 (*)     Creatinine, Ser 0.46 (*)     Albumin 3.2 (*)     All other components within normal limits  PROTIME-INR  URINALYSIS, ROUTINE W REFLEX MICROSCOPIC  POCT I-STAT TROPONIN I  CG4 I-STAT (LACTIC ACID)  CULTURE, BLOOD (ROUTINE X 2)  CULTURE, BLOOD (ROUTINE X 2)  URINE CULTURE   Dg Chest Port 1 View  08/05/2012  *RADIOLOGY REPORT*  Clinical Data: Cough and congestion  PORTABLE CHEST - 1 VIEW   Comparison: 03/17/2007  Findings: Hyperexpansion is consistent with emphysema.  No pulmonary edema.  There is bibasilar atelectasis or infiltrate, right greater than left. The cardiopericardial silhouette is enlarged. Telemetry leads overlie the chest.  IMPRESSION: Cardiomegaly with emphysema and bibasilar  atelectasis or infiltrate.   Original Report Authenticated By: Kennith Center, M.D.      1. Bronchospasm   2. CAP (community acquired pneumonia)   3. Atrial fibrillation   4. Heart failure       MDM  Pt feels improved after observation and/or treatment in ED.Patient  informed of clinical course, understand medical decision-making process, and agree with plan.        Hurman Horn, MD 08/05/12 [phone]

## 2012-08-05 NOTE — ED Notes (Signed)
EKG completed in triage and given to Dr. Fonnie Jarvis.

## 2012-08-06 LAB — URINE CULTURE
Colony Count: NO GROWTH
Culture: NO GROWTH

## 2012-08-07 ENCOUNTER — Ambulatory Visit (INDEPENDENT_AMBULATORY_CARE_PROVIDER_SITE_OTHER): Payer: 59 | Admitting: Internal Medicine

## 2012-08-07 ENCOUNTER — Encounter: Payer: Self-pay | Admitting: *Deleted

## 2012-08-07 ENCOUNTER — Encounter: Payer: Self-pay | Admitting: Internal Medicine

## 2012-08-07 VITALS — BP 130/84 | HR 55 | Ht 70.0 in | Wt 159.0 lb

## 2012-08-07 DIAGNOSIS — R0602 Shortness of breath: Secondary | ICD-10-CM

## 2012-08-07 DIAGNOSIS — I4891 Unspecified atrial fibrillation: Secondary | ICD-10-CM

## 2012-08-07 LAB — BASIC METABOLIC PANEL
CO2: 30 mEq/L (ref 19–32)
Calcium: 8.8 mg/dL (ref 8.4–10.5)
Sodium: 141 mEq/L (ref 135–145)

## 2012-08-07 LAB — BRAIN NATRIURETIC PEPTIDE: Pro B Natriuretic peptide (BNP): 111 pg/mL — ABNORMAL HIGH (ref 0.0–100.0)

## 2012-08-07 NOTE — Patient Instructions (Signed)
Prednisone dose pack.  Robitussin DM cough syrup.

## 2012-08-08 ENCOUNTER — Other Ambulatory Visit: Payer: Self-pay | Admitting: *Deleted

## 2012-08-08 DIAGNOSIS — E876 Hypokalemia: Secondary | ICD-10-CM

## 2012-08-08 LAB — CBC WITH DIFFERENTIAL/PLATELET
Basophils Absolute: 0.2 K/uL — ABNORMAL HIGH (ref 0.0–0.1)
Basophils Relative: 1.9 % (ref 0.0–3.0)
Eosinophils Absolute: 0.1 K/uL (ref 0.0–0.7)
Eosinophils Relative: 1.1 % (ref 0.0–5.0)
HCT: 34.3 % — ABNORMAL LOW (ref 36.0–46.0)
Hemoglobin: 11.4 g/dL — ABNORMAL LOW (ref 12.0–15.0)
Lymphocytes Relative: 25.9 % (ref 12.0–46.0)
Lymphs Abs: 2.3 K/uL (ref 0.7–4.0)
MCHC: 33.4 g/dL (ref 30.0–36.0)
MCV: 87.6 fl (ref 78.0–100.0)
Monocytes Absolute: 0.3 K/uL (ref 0.1–1.0)
Monocytes Relative: 3.4 % (ref 3.0–12.0)
Neutro Abs: 5.9 K/uL (ref 1.4–7.7)
Neutrophils Relative %: 67.7 % (ref 43.0–77.0)
Platelets: 339 K/uL (ref 150.0–400.0)
RBC: 3.91 Mil/uL (ref 3.87–5.11)
RDW: 14.5 % (ref 11.5–14.6)
WBC: 8.8 K/uL (ref 4.5–10.5)

## 2012-08-08 MED ORDER — POTASSIUM CHLORIDE CRYS ER 20 MEQ PO TBCR: 20.0000 meq | EXTENDED_RELEASE_TABLET | Freq: Every day | ORAL | Status: AC

## 2012-08-09 ENCOUNTER — Encounter: Payer: Self-pay | Admitting: Internal Medicine

## 2012-08-09 NOTE — Progress Notes (Signed)
HPI Patient is a 57 yo who was referred for evaluation of atrial fibrillation. The patient was seen in the [facility] ER on 12/28.  C/O 2 wks wheezing, SOB.  No CP  No palpitations.   She was found to be in atrial fibrillaton.  Did not want to stay.  Given Rx for diltiazem as well as an ABX for a community acquired pneumonia. Since she left the ER she is still wheezing.  Says her breathing is a little better only. Denies palpitations. Allergies  Allergen Reactions  . Sumycin (Tetracycline) Hives    Current Outpatient Prescriptions  Medication Sig Dispense Refill  . albuterol (PROVENTIL HFA;VENTOLIN HFA) 108 (90 BASE) MCG/ACT inhaler Inhale 2 puffs into the lungs every 2 (two) hours as needed for wheezing or shortness of breath (cough).  1 Inhaler  0  . aspirin EC 325 MG tablet Take 1 tablet (325 mg total) by mouth daily.  30 tablet  0  . azithromycin (ZITHROMAX Z-PAK) 250 MG tablet 2 po day one, then 1 daily x 4 days  5 tablet  0  . diltiazem (CARDIZEM CD) 120 MG 24 hr capsule Take 1 capsule (120 mg total) by mouth daily.  10 capsule  0  . furosemide (LASIX) 20 MG tablet Take 1 tablet (20 mg total) by mouth 2 (two) times daily.  6 tablet  0  . ibuprofen (ADVIL,MOTRIN) 200 MG tablet Take 600 mg by mouth every 6 (six) hours as needed. For pain      . potassium chloride SA (K-DUR,KLOR-CON) 20 MEQ tablet Take 1 tablet (20 mEq total) by mouth daily.  30 tablet  6    No past medical history on file.  Past Surgical History  Procedure Date  . Abdominal hysterectomy     No family history on file.  History   Social History  . Marital Status: Single    Spouse Name: N/A    Number of Children: N/A  . Years of Education: N/A   Occupational History  . Not on file.   Social History Main Topics  . Smoking status: Never Smoker   . Smokeless tobacco: Not on file  . Alcohol Use: Yes     Comment: occasional  . Drug Use: No  . Sexually Active:    Other Topics Concern  . Not on file    Social History Narrative  . No narrative on file    Review of Systems:  All systems reviewed.  They are negative to the above problem except as previously stated.  Vital Signs: BP 130/84  Pulse 55  Ht 5\' 10"  (1.778 m)  Wt 159 lb (72.122 kg)  BMI 22.81 kg/m2  SpO2 99%  Physical Exam Patient is in NAD at rest  HEENT:  Normocephalic, atraumatic. EOMI, PERRLA.  Neck: JVP is normal.  No bruits.  Lungs: Moving air.  Rhonchi, pops in L lung with some wheezes.  No rales.  Heart: Regular rate and rhythm. Normal S1, S2. No S3.   No significant murmurs. PMI not displaced.  Abdomen:  Supple, nontender. Normal bowel sounds. No masses. No hepatomegaly.  Extremities:   Good distal pulses throughout. No lower extremity edema.  Musculoskeletal :moving all extremities.  Neuro:   alert and oriented x3.  CN II-XII grossly intact.  EKG  SR Assessment and Plan:  1.  Atrial fibrillation.  Now back in SR.  May have been related to acute pulmonary problems.   Rec:  Will check TSH, BMET and BNP.  Will also sect up for echo.  2.  Pulm.  Lung exam is still signif abnormal.  I would continue ABX  I would also recomm a steroid dose pack   Also recomm Robitussen.   Patient needs to call back with response.

## 2012-08-11 LAB — CULTURE, BLOOD (ROUTINE X 2)

## 2012-08-25 ENCOUNTER — Other Ambulatory Visit (INDEPENDENT_AMBULATORY_CARE_PROVIDER_SITE_OTHER): Payer: 59

## 2012-08-25 DIAGNOSIS — E876 Hypokalemia: Secondary | ICD-10-CM

## 2012-08-25 LAB — BASIC METABOLIC PANEL
BUN: 25 mg/dL — ABNORMAL HIGH (ref 6–23)
CO2: 29 mEq/L (ref 19–32)
Chloride: 105 mEq/L (ref 96–112)
Creatinine, Ser: 0.6 mg/dL (ref 0.4–1.2)
Glucose, Bld: 89 mg/dL (ref 70–99)
Potassium: 3.8 mEq/L (ref 3.5–5.1)

## 2012-08-28 ENCOUNTER — Other Ambulatory Visit: Payer: Self-pay | Admitting: *Deleted

## 2012-08-28 DIAGNOSIS — I4891 Unspecified atrial fibrillation: Secondary | ICD-10-CM

## 2012-09-08 ENCOUNTER — Ambulatory Visit (HOSPITAL_COMMUNITY): Payer: 59 | Attending: Cardiology | Admitting: Radiology

## 2012-09-08 DIAGNOSIS — R0989 Other specified symptoms and signs involving the circulatory and respiratory systems: Secondary | ICD-10-CM | POA: Insufficient documentation

## 2012-09-08 DIAGNOSIS — I4891 Unspecified atrial fibrillation: Secondary | ICD-10-CM | POA: Insufficient documentation

## 2012-09-08 DIAGNOSIS — I059 Rheumatic mitral valve disease, unspecified: Secondary | ICD-10-CM | POA: Insufficient documentation

## 2012-09-08 DIAGNOSIS — I079 Rheumatic tricuspid valve disease, unspecified: Secondary | ICD-10-CM | POA: Insufficient documentation

## 2012-09-08 DIAGNOSIS — R0609 Other forms of dyspnea: Secondary | ICD-10-CM | POA: Insufficient documentation

## 2012-09-08 NOTE — Progress Notes (Signed)
Echocardiogram performed.  

## 2012-09-13 ENCOUNTER — Other Ambulatory Visit: Payer: Self-pay | Admitting: *Deleted

## 2012-09-13 DIAGNOSIS — R0602 Shortness of breath: Secondary | ICD-10-CM

## 2012-09-25 ENCOUNTER — Emergency Department (HOSPITAL_COMMUNITY)
Admission: EM | Admit: 2012-09-25 | Discharge: 2012-09-25 | Disposition: A | Discharge status: Home / Self Care | Payer: 59 | Attending: Emergency Medicine | Admitting: Emergency Medicine

## 2012-09-25 ENCOUNTER — Encounter (HOSPITAL_COMMUNITY): Payer: Self-pay | Admitting: Emergency Medicine

## 2012-09-25 DIAGNOSIS — J45909 Unspecified asthma, uncomplicated: Secondary | ICD-10-CM | POA: Insufficient documentation

## 2012-09-25 DIAGNOSIS — Z79899 Other long term (current) drug therapy: Secondary | ICD-10-CM | POA: Insufficient documentation

## 2012-09-25 DIAGNOSIS — M2550 Pain in unspecified joint: Secondary | ICD-10-CM

## 2012-09-25 DIAGNOSIS — Z7982 Long term (current) use of aspirin: Secondary | ICD-10-CM | POA: Insufficient documentation

## 2012-09-25 DIAGNOSIS — M25539 Pain in unspecified wrist: Secondary | ICD-10-CM | POA: Insufficient documentation

## 2012-09-25 DIAGNOSIS — M129 Arthropathy, unspecified: Secondary | ICD-10-CM | POA: Insufficient documentation

## 2012-09-25 DIAGNOSIS — M25579 Pain in unspecified ankle and joints of unspecified foot: Secondary | ICD-10-CM | POA: Insufficient documentation

## 2012-09-25 MED ORDER — IBUPROFEN 800 MG PO TABS: 800.0000 mg | ORAL_TABLET | Freq: Three times a day (TID) | ORAL | Status: DC

## 2012-09-25 MED ORDER — HYDROCODONE-ACETAMINOPHEN 5-325 MG PO TABS: 1.0000 | ORAL_TABLET | ORAL | Status: DC | PRN

## 2012-09-25 NOTE — ED Notes (Signed)
Started yesterday with right shoulder, right hand and left foot pain. No known injury. Works standing all day, hanging clothing.

## 2012-09-25 NOTE — ED Provider Notes (Signed)
Medical screening examination/treatment/procedure(s) were performed by non-physician practitioner and as supervising physician I was immediately available for consultation/collaboration.   David H Yao, MD 09/25/12 1557 

## 2012-09-25 NOTE — ED Provider Notes (Signed)
History     CSN: [id number]  Arrival date & time 09/25/12  0804   First MD Initiated Contact with Patient 09/25/12 [phone]      No chief complaint on file.   (Consider location/radiation/quality/duration/timing/severity/associated sxs/prior treatment) HPI Comments: Complains of right shoulder and wrist pain, left ankle pain without known injury. No fever, redness or significant swelling. No history of wrist or ankle joint pain, but has a history of recurrent right shoulder pain without diagnosis of arthritis.   The history is provided by the patient.    Past Medical History  Diagnosis Date  . Asthma     Past Surgical History  Procedure Laterality Date  . Abdominal hysterectomy    . Bilateral cataract surgery Bilateral     No family history on file.  History  Substance Use Topics  . Smoking status: Never Smoker   . Smokeless tobacco: Not on file  . Alcohol Use: Yes     Comment: occasional    OB History   Grav Para Term Preterm Abortions TAB SAB Ect Mult Living                  Review of Systems  Constitutional: Negative for fever and chills.  Respiratory: Negative.   Cardiovascular: Negative.   Gastrointestinal: Negative.   Musculoskeletal:       See HPI.  Skin: Negative.   Neurological: Negative.     Allergies  Sumycin  Home Medications   Current Outpatient Rx  Name  Route  Sig  Dispense  Refill  . aspirin EC 325 MG tablet   Oral   Take 1 tablet (325 mg total) by mouth daily.   30 tablet   0   . ibuprofen (ADVIL,MOTRIN) 200 MG tablet   Oral   Take 600 mg by mouth every 6 (six) hours as needed. For pain         . potassium chloride SA (K-DUR,KLOR-CON) 20 MEQ tablet   Oral   Take 1 tablet (20 mEq total) by mouth daily.   30 tablet   6     BP 128/88  Pulse 97  Temp(Src) 97 F (36.1 C) (Oral)  SpO2 99%  Physical Exam  Constitutional: She is oriented to person, place, and time. She appears well-developed and well-nourished.  Neck:  Normal range of motion.  Cardiovascular: Intact distal pulses.   Pulmonary/Chest: Effort normal.  Musculoskeletal: Normal range of motion. She exhibits no edema.  Right shoulder and wrist tenderness. Left ankle tenderness extending to MTP joints. No swelling, redness or loss of motion range.   Neurological: She is alert and oriented to person, place, and time. She has normal reflexes.  Skin: Skin is warm and dry.  Psychiatric: She has a normal mood and affect.    ED Course  Procedures (including critical care time)  Labs Reviewed - No data to display No results found.   No diagnosis found. 1. Arthralgias    MDM  No redness, fever - doubt infection as cause of arthralgia. She works standing all day, with over-use of right arm. Will treat conservatively and refer for follow up for persistent symptoms.        Arnoldo Hooker, PA-C 09/25/12 [phone]

## 2012-10-02 ENCOUNTER — Ambulatory Visit
Admission: RE | Admit: 2012-10-02 | Discharge: 2012-10-02 | Disposition: A | Discharge status: Home / Self Care | Payer: 59 | Source: Ambulatory Visit | Attending: Medical | Admitting: Medical

## 2012-10-02 ENCOUNTER — Encounter: Payer: Self-pay | Admitting: Medical

## 2012-10-02 ENCOUNTER — Ambulatory Visit (INDEPENDENT_AMBULATORY_CARE_PROVIDER_SITE_OTHER): Payer: 59 | Admitting: Medical

## 2012-10-02 VITALS — BP 120/80 | HR 76 | Temp 98.2°F | Resp 16 | Ht 68.0 in | Wt 162.0 lb

## 2012-10-02 DIAGNOSIS — M2550 Pain in unspecified joint: Secondary | ICD-10-CM

## 2012-10-02 DIAGNOSIS — M79644 Pain in right finger(s): Secondary | ICD-10-CM

## 2012-10-02 DIAGNOSIS — M25561 Pain in right knee: Secondary | ICD-10-CM

## 2012-10-02 LAB — SEDIMENTATION RATE: Sed Rate: 71 mm/hr — ABNORMAL HIGH (ref 0–22)

## 2012-10-02 LAB — URIC ACID: Uric Acid, Serum: 3.6 mg/dL (ref 2.4–7.0)

## 2012-10-02 MED ORDER — MELOXICAM 7.5 MG PO TABS: 7.5000 mg | ORAL_TABLET | Freq: Every day | ORAL | Status: DC

## 2012-10-02 MED ORDER — TRAMADOL HCL 50 MG PO TABS: 50.0000 mg | ORAL_TABLET | Freq: Three times a day (TID) | ORAL | Status: DC | PRN

## 2012-10-02 NOTE — Progress Notes (Signed)
Subjective: Here as a new patient.   No recent primary care, just ED for acute issues.   She reports recent onset of joint pains 2 wk ago.  Started with right shoulder, then left shoulder.  Then some big toe pain.   Foot started swelling in big toe on the left.  This got better.  But still ongoing pains in hands, shoulders, knees.  1wk of bad right knee pain.  Knee pain on the right has been the worst pain.  Hurts to bear weight, walk.  Pain can awake her in middle of the night.  Gets swelling in right knee, 2nd and 3rd fingers on the right only.  No problems with left knee.  Hips are fine.  No temple pain. She does have chronic headaches, headaches every other day on average.  She notes 1-2 hours of morning stiffness.  Joints pains other than knee improves with activity.  Went to the ED recently for the same joint pains, was diagnosed with arthritis.    Past Medical History  Diagnosis Date  . Asthma   . Headache   . Pneumonia 07/2012  . Wears hearing aid   . History of blood transfusion 2008   Review of Systems Constitutional: -fever, -chills, -sweats, -unexpected weight change Cardiology:  -chest pain, -palpitations, -edema Respiratory: -cough, -shortness of breath, -wheezing Gastroenterology: -abdominal pain, -nausea, -vomiting, -diarrhea, -constipation  Urology: -dysuria, -difficulty urinating, -hematuria, -urinary frequency, -urgency Neurology: -headache, -weakness, -tingling, -numbness  Objective: Gen: wd, wn, nad Skin: warm, dry, bilat thumb nails thickened, somewhat brownish coloration, but rest of nails not thickened, no pitting.  There is some clubbing noted Neck: prominent right carotid bulge/sinus bulge, otherwise no other neck anomaly Heart: RRR, normal S1, S2, no murmurs Lungs: clear MSK: tender and somewhat swollen right 2nd digit at DIP, few other DIPs with mild bony arthritic changes, mild generalized swelling of right knee, pain with right knee flexion, but no obvious joint  laxity, otherwise UE and LE unremarkable, normal ROM, nontender.  Overall muscle mass seems decreased. Pulses normal  Assessment: Encounter Diagnoses  Name Primary?  . Polyarthralgia Yes  . Headache   . Knee pain, right   . Finger pain, right   . Neck mass   . Elevated brain natriuretic peptide (BNP) level    Plan: Discussed possible differential. Likely just OA, but given the morning stiffness, polyarthralgia, will screen for RA, gout.  Labs and xrays today.   scripts for Mobic and Ultram for prn use.   F/u pending studies.  Neck mass- neck visit, discuss further, will plan to screen with ultrasound  Elevated BNP - saw cardiology recently for this.  Has f/u with cardiology pending.

## 2012-10-03 ENCOUNTER — Other Ambulatory Visit: Payer: Self-pay | Admitting: Medical

## 2012-10-05 ENCOUNTER — Telehealth: Payer: Self-pay | Admitting: Internal Medicine

## 2012-10-05 NOTE — Telephone Encounter (Signed)
For now, take Mobic 7.5mg , 1 tablet BID, take Ultram 50mg , 1-2 tablets TID prn pain.  Refer to rheumatology.  i will need to see her back after the rheumatology visit

## 2012-10-06 ENCOUNTER — Institutional Professional Consult (permissible substitution): Payer: 59 | Admitting: Pulmonary Disease

## 2012-10-06 ENCOUNTER — Telehealth: Payer: Self-pay | Admitting: Medical

## 2012-10-06 NOTE — Telephone Encounter (Signed)
Patient is aware of Crosby Oyster PA-C recommendations and her referrals. CLS

## 2012-10-06 NOTE — Telephone Encounter (Signed)
We don't have a formal diagnosis of RA yet.  So I cant write a letter stating any of these stipulations yet.  However, if she is doing that bad, lets get her into rheumatology ASAP.  When is the appt?

## 2012-10-06 NOTE — Telephone Encounter (Signed)
Patient is aware of what shane Tysinger PA-C had to say about RA. I fax over OV notes, labs and insurance card to [facility] For her appointment to see the rheumatology. CLS

## 2012-10-10 ENCOUNTER — Other Ambulatory Visit: Payer: 59

## 2012-10-11 ENCOUNTER — Telehealth: Payer: Self-pay | Admitting: Family Medicine

## 2012-10-11 NOTE — Telephone Encounter (Signed)
Patient is aware of her appointment at [facility] on 10/19/12 @ 900 am to see Dr. Dareen Piano. CLS [facility] on 10/10/12 @ 100 pm. CLS

## 2012-10-25 ENCOUNTER — Institutional Professional Consult (permissible substitution): Payer: 59 | Admitting: Pulmonary Disease

## 2012-11-07 ENCOUNTER — Encounter: Payer: Self-pay | Admitting: *Deleted

## 2012-11-07 ENCOUNTER — Encounter: Payer: Self-pay | Admitting: Internal Medicine

## 2012-11-07 ENCOUNTER — Ambulatory Visit (INDEPENDENT_AMBULATORY_CARE_PROVIDER_SITE_OTHER): Payer: 59 | Admitting: Internal Medicine

## 2012-11-07 ENCOUNTER — Ambulatory Visit (INDEPENDENT_AMBULATORY_CARE_PROVIDER_SITE_OTHER)
Admission: RE | Admit: 2012-11-07 | Discharge: 2012-11-07 | Disposition: A | Discharge status: Home / Self Care | Payer: 59 | Source: Ambulatory Visit | Attending: Internal Medicine | Admitting: Internal Medicine

## 2012-11-07 VITALS — BP 110/82 | HR 86 | Temp 99.0°F | Ht 68.0 in | Wt 155.0 lb

## 2012-11-07 DIAGNOSIS — R0989 Other specified symptoms and signs involving the circulatory and respiratory systems: Secondary | ICD-10-CM

## 2012-11-07 DIAGNOSIS — R0600 Dyspnea, unspecified: Secondary | ICD-10-CM

## 2012-11-07 DIAGNOSIS — R0609 Other forms of dyspnea: Secondary | ICD-10-CM

## 2012-11-07 MED ORDER — PANTOPRAZOLE SODIUM 40 MG PO TBEC: 40.0000 mg | DELAYED_RELEASE_TABLET | Freq: Every day | ORAL | Status: AC

## 2012-11-07 MED ORDER — FAMOTIDINE 20 MG PO TABS: ORAL_TABLET | ORAL | Status: DC

## 2012-11-07 NOTE — Patient Instructions (Addendum)
Pantoprazole (protonix) 40 mg   Take 30-60 min before first meal of the day and Pepcid 20 mg one bedtime until return to office - this is the best way to tell whether stomach acid is contributing to your problem.    GERD (REFLUX)  is an extremely common cause of respiratory symptoms, many times with no significant heartburn at all.    It can be treated with medication, but also with lifestyle changes including avoidance of late meals, excessive alcohol, smoking cessation, and avoid fatty foods, chocolate, peppermint, colas, red wine, and acidic juices such as orange juice.  NO MINT OR MENTHOL PRODUCTS SO NO COUGH DROPS  USE SUGARLESS CANDY INSTEAD (jolley ranchers or Stover's)  NO OIL BASED VITAMINS - use powdered substitutes.    Please remember to go to the x-ray department downstairs for your tests - we will call you with the results when they are available.     Please schedule a follow up office visit in 4 weeks, sooner if needed pft's

## 2012-11-07 NOTE — Progress Notes (Signed)
  Subjective:    Patient ID: [name], female    DOB: [birth year] MRN: [id number]  HPI  1 yowf with lifelong poor ex tolerance then smoked quit 2002 treated with inhaler but couldn't take it and worse since late Dec/ Jan when dx 'd pna at ER (though cxr 12/28 not classic)  and referred 11/07/2012 pulmonary  by Dr Dietrich Pates for sob  evaluation.  11/07/2012 1st pulmonary eval cc doe x [facility] at [facility] x Dec 2013 fairly rapid onset assoc with hoarseness, min dry cough.  Has not tried inhalers.  Assoc with polyarthralgia with esr 71 otherwise w/u neg to date including echo  Improved p prednisone but not back to baseline so referred to pulmonary   No obvious daytime variabilty or assoc  cp or chest tightness, subjective wheeze overt sinus or hb symptoms. No unusual exp hx or h/o childhood pna/ asthma or premature birth to her knowledge.   Sleeping ok without nocturnal  or early am exacerbation  of respiratory  c/o's or need for noct saba. Also denies any obvious fluctuation of symptoms with weather or environmental changes or other aggravating or alleviating factors except as outlined above     Review of Systems  Constitutional: Negative for fever and unexpected weight change.  HENT: Negative for ear pain, nosebleeds, congestion, sore throat, rhinorrhea, sneezing, trouble swallowing, dental problem, postnasal drip and sinus pressure.   Eyes: Negative for redness and itching.  Respiratory: Positive for cough and shortness of breath. Negative for chest tightness and wheezing.   Cardiovascular: Positive for palpitations. Negative for leg swelling.  Gastrointestinal: Negative for nausea and vomiting.  Genitourinary: Negative for dysuria.  Musculoskeletal: Negative for joint swelling.  Skin: Negative for rash.  Neurological: Negative for headaches.  Hematological: Does not bruise/bleed easily.  Psychiatric/Behavioral: Negative for dysphoric mood. The patient is nervous/anxious.       Objective:   Physical Exam  Wt Readings from Last 3 Encounters:  11/07/12 155 lb (70.308 kg)  10/02/12 162 lb (73.483 kg)  08/07/12 159 lb (72.122 kg)    Hoarse amb wf nad, freq throat clearing  HEENT: nl dentition, turbinates, and orophanx. Nl external ear canals without cough reflex   NECK :  without JVD/Nodes/TM/ nl carotid upstrokes bilaterally   LUNGS: no acc muscle use, clear to A and P bilaterally without cough on insp or exp maneuvers   CV:  RRR  no s3 or murmur or increase in P2, no edema   ABD:  soft and nontender with nl excursion in the supine position. No bruits or organomegaly, bowel sounds nl  MS:  warm without deformities, calf tenderness, cyanosis or clubbing  SKIN: warm and dry without lesions    NEURO:  alert, approp, no deficits    CXR  11/07/2012 :   No evidence of acute cardiopulmonary disease      Assessment & Plan:

## 2012-11-08 NOTE — Progress Notes (Signed)
Quick Note:  Spoke with pt and notified of results per Dr. Wert. Pt verbalized understanding and denied any questions.  ______ 

## 2012-11-08 NOTE — Assessment & Plan Note (Addendum)
-   11/07/2012  Walked RA x 2 laps @ 185 ft each stopped due to  End of study, not desat  Symptoms are not reproducible here and  markedly disproportionate to objective findings and not clear this is a lung problem but pt does appear to have difficult airway management issues.  DDX of  difficult airways managment all start with A and  include Adherence, Ace Inhibitors, Acid Reflux, Active Sinus Disease, Alpha 1 Antitripsin deficiency, Anxiety masquerading as Airways dz,  ABPA,  allergy(esp in young), Aspiration (esp in elderly), Adverse effects of DPI,  Active smokers, plus two Bs  = Bronchiectasis and Beta blocker use..and one C= CHF  Acid reflux fits best based on assoc hoarseness and dry cough> try max rx then regroup with pfts in 4 weeks  CHF > cardiology w/u neg per Tenny Craw

## 2012-11-19 ENCOUNTER — Emergency Department (HOSPITAL_COMMUNITY): Payer: 59

## 2012-11-19 ENCOUNTER — Inpatient Hospital Stay (HOSPITAL_COMMUNITY)
Admission: EM | Admit: 2012-11-19 | Discharge: 2012-11-27 | DRG: 482 | Disposition: A | Discharge status: Home / Self Care | Payer: 59 | Attending: Family Medicine | Admitting: Family Medicine

## 2012-11-19 ENCOUNTER — Encounter (HOSPITAL_COMMUNITY): Payer: Self-pay | Admitting: Internal Medicine

## 2012-11-19 DIAGNOSIS — S72143A Displaced intertrochanteric fracture of unspecified femur, initial encounter for closed fracture: Secondary | ICD-10-CM

## 2012-11-19 DIAGNOSIS — M069 Rheumatoid arthritis, unspecified: Secondary | ICD-10-CM

## 2012-11-19 DIAGNOSIS — S72009A Fracture of unspecified part of neck of unspecified femur, initial encounter for closed fracture: Principal | ICD-10-CM | POA: Diagnosis present

## 2012-11-19 DIAGNOSIS — K219 Gastro-esophageal reflux disease without esophagitis: Secondary | ICD-10-CM

## 2012-11-19 DIAGNOSIS — Y929 Unspecified place or not applicable: Secondary | ICD-10-CM

## 2012-11-19 DIAGNOSIS — J45909 Unspecified asthma, uncomplicated: Secondary | ICD-10-CM

## 2012-11-19 DIAGNOSIS — Y9366 Activity, soccer: Secondary | ICD-10-CM

## 2012-11-19 DIAGNOSIS — S72001D Fracture of unspecified part of neck of right femur, subsequent encounter for closed fracture with routine healing: Secondary | ICD-10-CM

## 2012-11-19 DIAGNOSIS — W010XXA Fall on same level from slipping, tripping and stumbling without subsequent striking against object, initial encounter: Secondary | ICD-10-CM | POA: Diagnosis present

## 2012-11-19 DIAGNOSIS — S72001A Fracture of unspecified part of neck of right femur, initial encounter for closed fracture: Secondary | ICD-10-CM

## 2012-11-19 DIAGNOSIS — IMO0002 Reserved for concepts with insufficient information to code with codable children: Secondary | ICD-10-CM

## 2012-11-19 DIAGNOSIS — Z87891 Personal history of nicotine dependence: Secondary | ICD-10-CM

## 2012-11-19 LAB — BASIC METABOLIC PANEL
BUN: 20 mg/dL (ref 6–23)
CO2: 24 mEq/L (ref 19–32)
Calcium: 10.3 mg/dL (ref 8.4–10.5)
Chloride: 99 mEq/L (ref 96–112)
Creatinine, Ser: 0.52 mg/dL (ref 0.50–1.10)
GFR calc Af Amer: >90 mL/min (ref >90)
GFR calc non Af Amer: >90 mL/min (ref >90)
Glucose, Bld: 137 mg/dL — ABNORMAL HIGH (ref 70–99)
Potassium: 4.9 mEq/L (ref 3.5–5.1)
Sodium: 135 mEq/L (ref 135–145)

## 2012-11-19 LAB — URINALYSIS, ROUTINE W REFLEX MICROSCOPIC
Bilirubin Urine: NEGATIVE
Glucose, UA: NEGATIVE mg/dL
Hgb urine dipstick: NEGATIVE
Ketones, ur: NEGATIVE mg/dL
Leukocytes, UA: NEGATIVE
Nitrite: NEGATIVE
Protein, ur: NEGATIVE mg/dL
Specific Gravity, Urine: 1.017 (ref 1.005–1.030)
Urobilinogen, UA: 0.2 mg/dL (ref 0.0–1.0)
pH: 8 (ref 5.0–8.0)

## 2012-11-19 LAB — CBC
HCT: 39.1 % (ref 36.0–46.0)
Hemoglobin: 13.4 g/dL (ref 12.0–15.0)
MCH: 28.3 pg (ref 26.0–34.0)
MCHC: 34.3 g/dL (ref 30.0–36.0)
MCV: 82.5 fL (ref 78.0–100.0)
Platelets: 232 10*3/uL (ref 150–400)
RBC: 4.74 MIL/uL (ref 3.87–5.11)
RDW: 15.9 % — ABNORMAL HIGH (ref 11.5–15.5)
WBC: 6.5 10*3/uL (ref 4.0–10.5)

## 2012-11-19 MED ORDER — MORPHINE SULFATE 4 MG/ML IJ SOLN: 4.0000 mg | Freq: Once | INTRAMUSCULAR | Status: DC

## 2012-11-19 MED ORDER — HEPARIN SODIUM (PORCINE) 5000 UNIT/ML IJ SOLN
5000.0000 [IU] | Freq: Three times a day (TID) | INTRAMUSCULAR | Status: DC
  Administered 2012-11-20 (×2): 5000 [IU] via SUBCUTANEOUS
  Filled 2012-11-19 (×5): qty 1

## 2012-11-19 MED ORDER — CHLORHEXIDINE GLUCONATE 4 % EX LIQD
60.0000 mL | Freq: Once | CUTANEOUS | Status: AC
  Administered 2012-11-20: 4 via TOPICAL
  Filled 2012-11-19: qty 60

## 2012-11-19 MED ORDER — POTASSIUM CHLORIDE CRYS ER 20 MEQ PO TBCR
20.0000 meq | EXTENDED_RELEASE_TABLET | Freq: Every day | ORAL | Status: DC
  Administered 2012-11-20 – 2012-11-27 (×8): 20 meq via ORAL
  Filled 2012-11-19 (×8): qty 1

## 2012-11-19 MED ORDER — OXYCODONE HCL 5 MG PO TABS
5.0000 mg | ORAL_TABLET | ORAL | Status: DC | PRN
  Administered 2012-11-20 – 2012-11-22 (×6): 5 mg via ORAL
  Filled 2012-11-19 (×7): qty 1

## 2012-11-19 MED ORDER — ASPIRIN EC 325 MG PO TBEC
325.0000 mg | DELAYED_RELEASE_TABLET | Freq: Every day | ORAL | Status: DC
  Filled 2012-11-19: qty 1

## 2012-11-19 MED ORDER — DEXTROSE-NACL 5-0.9 % IV SOLN
INTRAVENOUS | Status: DC
  Administered 2012-11-20: via INTRAVENOUS

## 2012-11-19 MED ORDER — ONDANSETRON HCL 4 MG/2ML IJ SOLN: 4.0000 mg | Freq: Four times a day (QID) | INTRAMUSCULAR | Status: DC | PRN

## 2012-11-19 MED ORDER — DIAZEPAM 5 MG/ML IJ SOLN
5.0000 mg | Freq: Once | INTRAMUSCULAR | Status: AC
  Administered 2012-11-19: 5 mg via INTRAVENOUS
  Filled 2012-11-19: qty 2

## 2012-11-19 MED ORDER — ONDANSETRON HCL 4 MG PO TABS
4.0000 mg | ORAL_TABLET | Freq: Four times a day (QID) | ORAL | Status: DC | PRN
  Administered 2012-11-26 (×2): 4 mg via ORAL
  Filled 2012-11-19 (×2): qty 1

## 2012-11-19 MED ORDER — PREDNISONE 10 MG PO TABS
10.0000 mg | ORAL_TABLET | Freq: Every day | ORAL | Status: DC
  Administered 2012-11-20 – 2012-11-27 (×8): 10 mg via ORAL
  Filled 2012-11-19 (×8): qty 1

## 2012-11-19 MED ORDER — FOLIC ACID 1 MG PO TABS
1.0000 mg | ORAL_TABLET | Freq: Every day | ORAL | Status: DC
  Administered 2012-11-20 – 2012-11-27 (×8): 1 mg via ORAL
  Filled 2012-11-19 (×8): qty 1

## 2012-11-19 MED ORDER — MORPHINE SULFATE 4 MG/ML IJ SOLN
4.0000 mg | Freq: Once | INTRAMUSCULAR | Status: AC
  Administered 2012-11-19: 4 mg via INTRAVENOUS
  Filled 2012-11-19: qty 1

## 2012-11-19 MED ORDER — HYDROMORPHONE HCL PF 1 MG/ML IJ SOLN
1.0000 mg | INTRAMUSCULAR | Status: DC | PRN
  Administered 2012-11-19 – 2012-11-22 (×12): 1 mg via INTRAVENOUS
  Filled 2012-11-19 (×12): qty 1

## 2012-11-19 MED ORDER — DOCUSATE SODIUM 100 MG PO CAPS
100.0000 mg | ORAL_CAPSULE | Freq: Two times a day (BID) | ORAL | Status: DC
  Administered 2012-11-20 – 2012-11-25 (×13): 100 mg via ORAL
  Filled 2012-11-19 (×17): qty 1

## 2012-11-19 MED ORDER — PANTOPRAZOLE SODIUM 40 MG PO TBEC
40.0000 mg | DELAYED_RELEASE_TABLET | Freq: Every day | ORAL | Status: DC
  Administered 2012-11-21 – 2012-11-27 (×7): 40 mg via ORAL
  Filled 2012-11-19 (×7): qty 1

## 2012-11-19 MED ORDER — MORPHINE SULFATE 4 MG/ML IJ SOLN
6.0000 mg | Freq: Once | INTRAMUSCULAR | Status: AC
  Administered 2012-11-19: 6 mg via INTRAVENOUS
  Filled 2012-11-19: qty 2

## 2012-11-19 NOTE — H&P (Signed)
[facility] History and Physical  [name]:[id number] DOB: 1955-09-12    PCP:   Ernst Breach, PA-C   Chief Complaint: Right intertrochanteric Fx.  HPI: [name] is an 57 y.o. female  With hx of RA on MTx and Prednisone, Dx about one month ago, asthma, fell playing soccer with her two daughters and presents to the ER with right hip pain.  Evaluation in the ER included an Xray of the hip showing right intertrochanteric Fx.  She has normal WBC and Hb.  Her CXR is clear and her renal fx tests were normal.  Orthopedics was consulted and hospitalist was asked to admit her for right hip Fx.  In general, she does well without any exertional CP or DOE.  Rewiew of Systems:  Constitutional: Negative for malaise, fever and chills. No significant weight loss or weight gain Eyes: Negative for eye pain, redness and discharge, diplopia, visual changes, or flashes of light. ENMT: Negative for ear pain, hoarseness, nasal congestion, sinus pressure and sore throat. No headaches; tinnitus, drooling, or problem swallowing. Cardiovascular: Negative for chest pain, palpitations, diaphoresis, dyspnea and peripheral edema. ; No orthopnea, PND Respiratory: Negative for cough, hemoptysis, wheezing and stridor. No pleuritic chestpain. Gastrointestinal: Negative for nausea, vomiting, diarrhea, constipation, abdominal pain, melena, blood in stool, hematemesis, jaundice and rectal bleeding.    Genitourinary: Negative for frequency, dysuria, incontinence,flank pain and hematuria; Musculoskeletal: Negative for back pain and neck pain. Negative for swelling and trauma.;  Skin: . Negative for pruritus, rash, abrasions, bruising and skin lesion.; ulcerations Neuro: Negative for headache, lightheadedness and neck stiffness. Negative for weakness, altered level of consciousness , altered mental status, extremity weakness, burning feet, involuntary movement, seizure and syncope.  Psych: negative for  anxiety, depression, insomnia, tearfulness, panic attacks, hallucinations, paranoia, suicidal or homicidal ideation    Past Medical History  Diagnosis Date  . Asthma   . Headache   . Pneumonia 07/2012  . Wears hearing aid   . History of blood transfusion 2008    Past Surgical History  Procedure Laterality Date  . Bilateral cataract surgery Bilateral   . Abdominal hysterectomy      total, due to fibroid  . Cataract extraction      Medications:  HOME MEDS: Prior to Admission medications   Medication Sig Start Date End Date Taking? Authorizing Provider  aspirin EC 325 MG tablet Take 1 tablet (325 mg total) by mouth daily. 08/05/12  Yes Hurman Horn, MD  folic acid (FOLVITE) 1 MG tablet Take 1 mg by mouth daily.   Yes Historical Provider, MD  pantoprazole (PROTONIX) 40 MG tablet Take 1 tablet (40 mg total) by mouth daily. Take 30-60 min before first meal of the day 11/07/12  Yes Nyoka Cowden, MD  potassium chloride SA (K-DUR,KLOR-CON) 20 MEQ tablet Take 1 tablet (20 mEq total) by mouth daily. 08/08/12  Yes Pricilla Riffle, MD  predniSONE (DELTASONE) 10 MG tablet Take 10 mg by mouth daily.   Yes Historical Provider, MD  PRESCRIPTION MEDICATION Receives weekly injection for arthritis at patient's doctor's office   Yes Historical Provider, MD  traMADol (ULTRAM) 50 MG tablet Take 1 tablet (50 mg total) by mouth every 8 (eight) hours as needed for pain. 10/02/12  Yes Jac Canavan, PA-C     Allergies:  Allergies  Allergen Reactions  . Sumycin (Tetracycline) Hives    Social History:   reports that she quit smoking about 12 years ago. She does not have  any smokeless tobacco history on file. She reports that  drinks alcohol. She reports that she does not use illicit drugs.  Family History: No family history on file.   Physical Exam: Filed Vitals:   11/19/12 2145 11/19/12 2200 11/19/12 2215 11/19/12 2312  BP: 144/80 129/74 120/75 155/93  Pulse: 78 81 72 91  Temp:    98.2 F  (36.8 C)  TempSrc:    Oral  Resp: 15 13 16 17   SpO2: 100% 100% 100% 100%   Blood pressure 155/93, pulse 91, temperature 98.2 F (36.8 C), temperature source Oral, resp. rate 17, SpO2 100.00%.  GEN:  Pleasant  patient lying in the stretcher in no acute distress; cooperative with exam. PSYCH:  alert and oriented x4; does not appear anxious or depressed; affect is appropriate. HEENT: Mucous membranes pink and anicteric; PERRLA; EOM intact; no cervical lymphadenopathy nor thyromegaly or carotid bruit; no JVD; There were no stridor. Neck is very supple. Breasts:: Not examined CHEST WALL: No tenderness CHEST: Normal respiration, clear to auscultation bilaterally.  HEART: Regular rate and rhythm.  There are no murmur, rub, or gallops.   BACK: No kyphosis or scoliosis; no CVA tenderness ABDOMEN: soft and non-tender; no masses, no organomegaly, normal abdominal bowel sounds; no pannus; no intertriginous candida. There is no rebound and no distention. Rectal Exam: Not done EXTREMITIES: No bone or joint deformity; age-appropriate arthropathy of the hands and knees; no edema; no ulcerations.  There is no calf tenderness. She has right shorter leg and external rotation. Genitalia: not examined PULSES: 2+ and symmetric SKIN: Normal hydration no rash or ulceration CNS: Cranial nerves 2-12 grossly intact no focal lateralizing neurologic deficit.  Speech is fluent; uvula elevated with phonation, facial symmetry and tongue midline. DTR are normal bilaterally, cerebella exam is intact, barbinski is negative and strengths are equaled bilaterally.  No sensory loss.   Labs on Admission:  Basic Metabolic Panel:  Recent Labs Lab 11/19/12 1900  NA 135  K 4.9  CL 99  CO2 24  GLUCOSE 137*  BUN 20  CREATININE 0.52  CALCIUM 10.3   Liver Function Tests: No results found for this basename: AST, ALT, ALKPHOS, BILITOT, PROT, ALBUMIN,  in the last 168 hours No results found for this basename: LIPASE,  AMYLASE,  in the last 168 hours No results found for this basename: AMMONIA,  in the last 168 hours CBC:  Recent Labs Lab 11/19/12 1900  WBC 6.5  HGB 13.4  HCT 39.1  MCV 82.5  PLT 232   Cardiac Enzymes: No results found for this basename: CKTOTAL, CKMB, CKMBINDEX, TROPONINI,  in the last 168 hours  CBG: No results found for this basename: GLUCAP,  in the last 168 hours   Radiological Exams on Admission: Dg Chest 1 View  11/19/2012  *RADIOLOGY REPORT*  Clinical Data: Fall.  Hip pain.  CHEST - 1 VIEW  Comparison: PA and lateral chest 11/07/2012.  Findings: Again seen is cardiomegaly without edema.  No pneumothorax or pleural effusion. Remote healed rib fractures are noted.  IMPRESSION: No acute disease.   Original Report Authenticated By: Holley Dexter, M.D.    Dg Hip Complete Right  11/19/2012  *RADIOLOGY REPORT*  Clinical Data: Fall, right hip pain and bruising.  RIGHT HIP - COMPLETE 2+ VIEW  Comparison: None.  Findings: The patient has an acute right intertrochanteric fracture.  No other focal bony or joint abnormality is identified.  IMPRESSION: Acute right intertrochanteric fracture.   Original Report Authenticated By: Holley Dexter, M.D.  Assessment/Plan Present on Admission:  . Rheumatoid arthritis Right hip fracture. Chronic steroid use.  PLAN:  Will admit her for right hip fx.  Ortho has been consulted.  Will give IV pain medication.  I have continued her Prednisone without stress dose steroids.  She will need an EKG, and if normal, accepting an increase perioperative cardiovascular complication, she is clear for surgery. I have given her subcut heparin. She is made NPO.  She is stable, full code, and will be admitted to [facility] service.  Thank you for allowing me to participate in the care of your patient.  Other plans as per orders.  Code Status: FULL Unk Lightning, MD. [facility] Pager [phone] 7pm to 7am.  11/19/2012, 11:27 PM

## 2012-11-19 NOTE — ED Notes (Signed)
Patient transported to X-ray 

## 2012-11-19 NOTE — Consult Note (Signed)
Reason for Consult:right hip pain Referring Physician: EDP  HPI: [name] is an 57 y.o. female s/p fall onto right this afternoon playing soccer with daughter with immediate complaints of right hip pain and inability to bear weight. Reports history of RA on chronic prednisone, MTX. Dr Azzie Roup is her rheumatologist.  Past Medical History  Diagnosis Date  . Asthma   . Headache   . Pneumonia 07/2012  . Wears hearing aid   . History of blood transfusion 2008    Past Surgical History  Procedure Laterality Date  . Bilateral cataract surgery Bilateral   . Abdominal hysterectomy      total, due to fibroid  . Cataract extraction      No family history on file.  Social History:  reports that she quit smoking about 12 years ago. She does not have any smokeless tobacco history on file. She reports that  drinks alcohol. She reports that she does not use illicit drugs.  Allergies:  Allergies  Allergen Reactions  . Sumycin (Tetracycline) Hives    Medications: med list unavailable. Reports taking chronic prednisone, MTX, and "reflux drug".  Results for orders placed during the hospital encounter of 11/19/12 (from the past 48 hour(s))  CBC     Status: Abnormal   Collection Time    11/19/12  7:00 PM      Result Value Range   WBC 6.5  4.0 - 10.5 K/uL   RBC 4.74  3.87 - 5.11 MIL/uL   Hemoglobin 13.4  12.0 - 15.0 g/dL   HCT 56.2  13.0 - 86.5 %   MCV 82.5  78.0 - 100.0 fL   MCH 28.3  26.0 - 34.0 pg   MCHC 34.3  30.0 - 36.0 g/dL   RDW 78.4 (*) 69.6 - 29.5 %   Platelets 232  150 - 400 K/uL  BASIC METABOLIC PANEL     Status: Abnormal   Collection Time    11/19/12  7:00 PM      Result Value Range   Sodium 135  135 - 145 mEq/L   Potassium 4.9  3.5 - 5.1 mEq/L   Chloride 99  96 - 112 mEq/L   CO2 24  19 - 32 mEq/L   Glucose, Bld 137 (*) 70 - 99 mg/dL   BUN 20  6 - 23 mg/dL   Creatinine, Ser 2.84  0.50 - 1.10 mg/dL   Calcium 13.2  8.4 - 44.0 mg/dL   GFR calc non Af Amer  >90  >90 mL/min   GFR calc Af Amer >90  >90 mL/min   Comment:            The eGFR has been calculated     using the CKD EPI equation.     This calculation has not been     validated in all clinical     situations.     eGFR's persistently     <90 mL/min signify     possible Chronic Kidney Disease.  URINALYSIS, ROUTINE W REFLEX MICROSCOPIC     Status: Abnormal   Collection Time    11/19/12  9:16 PM      Result Value Range   Color, Urine YELLOW  YELLOW   APPearance CLOUDY (*) CLEAR   Specific Gravity, Urine 1.017  1.005 - 1.030   pH 8.0  5.0 - 8.0   Glucose, UA NEGATIVE  NEGATIVE mg/dL   Hgb urine dipstick NEGATIVE  NEGATIVE   Bilirubin Urine NEGATIVE  NEGATIVE  Ketones, ur NEGATIVE  NEGATIVE mg/dL   Protein, ur NEGATIVE  NEGATIVE mg/dL   Urobilinogen, UA 0.2  0.0 - 1.0 mg/dL   Nitrite NEGATIVE  NEGATIVE   Leukocytes, UA NEGATIVE  NEGATIVE   Comment: MICROSCOPIC NOT DONE ON URINES WITH NEGATIVE PROTEIN, BLOOD, LEUKOCYTES, NITRITE, OR GLUCOSE <1000 mg/dL.    Dg Chest 1 View  11/19/2012  *RADIOLOGY REPORT*  Clinical Data: Fall.  Hip pain.  CHEST - 1 VIEW  Comparison: PA and lateral chest 11/07/2012.  Findings: Again seen is cardiomegaly without edema.  No pneumothorax or pleural effusion. Remote healed rib fractures are noted.  IMPRESSION: No acute disease.   Original Report Authenticated By: Holley Dexter, M.D.    Dg Hip Complete Right  11/19/2012  *RADIOLOGY REPORT*  Clinical Data: Fall, right hip pain and bruising.  RIGHT HIP - COMPLETE 2+ VIEW  Comparison: None.  Findings: The patient has an acute right intertrochanteric fracture.  No other focal bony or joint abnormality is identified.  IMPRESSION: Acute right intertrochanteric fracture.   Original Report Authenticated By: Holley Dexter, M.D.      Vitals Temp:  [97.7 F (36.5 C)] 97.7 F (36.5 C) (04/13 1843) Pulse Rate:  [58-75] 58 (04/13 2055) Resp:  [20] 20 (04/13 1843) BP: (133-148)/(70-95) 133/70 mmHg (04/13  2055) SpO2:  [99 %-100 %] 100 % (04/13 2055) There is no weight on file to calculate BMI.  Physical Exam: slender WF, A and O with stigmata of RA. Neck Pura Picinich, no clavicular tenderness. Upper extremities with no gross bone or joint instability. LLE stable. RLE with diffuse tenderness about hip with severe pain on attempts at motion. N/V intact distally BLE.     Assessment/Plan: Impression:  1 right intertrochanteric hip fracture 2 RA Treatment: I have discussed with Ms. Sherburne treatment options and risks vs benefits thereof. Plan is for ORIF tomorrow late afternoon/early evening. NPO after midnight  Mehkai Gallo M 11/19/2012, 9:48 PM

## 2012-11-19 NOTE — ED Notes (Signed)
Pt c/o R hip pain onset post fall onto grass while playing soccer with daughter, pt landed on R hip, per report pt attempted to walk with a walker to her apt & fell on R hip onto pavement, pt has external rotation, pt denies head injury & LOC, pt denies neck & back pain, pt A&O x4, follows commands, speaks in complete sentences

## 2012-11-20 ENCOUNTER — Encounter (HOSPITAL_COMMUNITY): Payer: Self-pay | Admitting: Anesthesiology

## 2012-11-20 ENCOUNTER — Inpatient Hospital Stay (HOSPITAL_COMMUNITY): Payer: 59

## 2012-11-20 ENCOUNTER — Encounter (HOSPITAL_COMMUNITY): Payer: Self-pay | Admitting: Certified Registered"

## 2012-11-20 ENCOUNTER — Inpatient Hospital Stay (HOSPITAL_COMMUNITY): Payer: 59 | Admitting: Anesthesiology

## 2012-11-20 ENCOUNTER — Encounter (HOSPITAL_COMMUNITY)
Admission: EM | Disposition: A | Discharge status: Home / Self Care | Payer: Self-pay | Source: Home / Self Care | Attending: Family Medicine

## 2012-11-20 DIAGNOSIS — J45909 Unspecified asthma, uncomplicated: Secondary | ICD-10-CM

## 2012-11-20 DIAGNOSIS — K219 Gastro-esophageal reflux disease without esophagitis: Secondary | ICD-10-CM

## 2012-11-20 HISTORY — PX: COMPRESSION HIP SCREW: SHX1386

## 2012-11-20 LAB — CBC
Hemoglobin: 11.6 g/dL — ABNORMAL LOW (ref 12.0–15.0)
MCH: 28.4 pg (ref 26.0–34.0)
Platelets: 189 10*3/uL (ref 150–400)
RBC: 4.09 MIL/uL (ref 3.87–5.11)
WBC: 9.9 10*3/uL (ref 4.0–10.5)

## 2012-11-20 LAB — CREATININE, SERUM
Creatinine, Ser: 0.49 mg/dL — ABNORMAL LOW (ref 0.50–1.10)
GFR calc Af Amer: >90 mL/min (ref >90)
GFR calc non Af Amer: >90 mL/min (ref >90)

## 2012-11-20 SURGERY — COMPRESSION HIP
Anesthesia: General | Site: Hip | Laterality: Right | Wound class: Clean

## 2012-11-20 MED ORDER — BISACODYL 10 MG RE SUPP
10.0000 mg | Freq: Every day | RECTAL | Status: DC | PRN
  Filled 2012-11-20: qty 1

## 2012-11-20 MED ORDER — ONDANSETRON HCL 4 MG/2ML IJ SOLN
INTRAMUSCULAR | Status: DC | PRN
  Administered 2012-11-20: 4 mg via INTRAVENOUS

## 2012-11-20 MED ORDER — FLEET ENEMA 7-19 GM/118ML RE ENEM: 1.0000 | ENEMA | Freq: Once | RECTAL | Status: AC | PRN

## 2012-11-20 MED ORDER — 0.9 % SODIUM CHLORIDE (POUR BTL) OPTIME
TOPICAL | Status: DC | PRN
  Administered 2012-11-20: 1000 mL

## 2012-11-20 MED ORDER — METOCLOPRAMIDE HCL 5 MG/ML IJ SOLN: 5.0000 mg | Freq: Three times a day (TID) | INTRAMUSCULAR | Status: DC | PRN

## 2012-11-20 MED ORDER — ACETAMINOPHEN 325 MG PO TABS
650.0000 mg | ORAL_TABLET | Freq: Four times a day (QID) | ORAL | Status: DC | PRN
  Administered 2012-11-20: 650 mg via ORAL
  Filled 2012-11-20: qty 2

## 2012-11-20 MED ORDER — ACETAMINOPHEN 650 MG RE SUPP: 650.0000 mg | Freq: Four times a day (QID) | RECTAL | Status: DC | PRN

## 2012-11-20 MED ORDER — HYDROMORPHONE HCL PF 1 MG/ML IJ SOLN
INTRAMUSCULAR | Status: AC
  Administered 2012-11-20: 0.5 mg via INTRAVENOUS
  Filled 2012-11-20: qty 1

## 2012-11-20 MED ORDER — ONDANSETRON HCL 4 MG PO TABS: 4.0000 mg | ORAL_TABLET | Freq: Four times a day (QID) | ORAL | Status: DC | PRN

## 2012-11-20 MED ORDER — LACTATED RINGERS IV SOLN
INTRAVENOUS | Status: DC
  Administered 2012-11-20: 17:00:00 via INTRAVENOUS

## 2012-11-20 MED ORDER — CEFAZOLIN SODIUM-DEXTROSE 2-3 GM-% IV SOLR
2.0000 g | Freq: Four times a day (QID) | INTRAVENOUS | Status: AC
  Administered 2012-11-21 (×2): 2 g via INTRAVENOUS
  Filled 2012-11-20 (×2): qty 50

## 2012-11-20 MED ORDER — ONDANSETRON HCL 4 MG/2ML IJ SOLN: 4.0000 mg | Freq: Four times a day (QID) | INTRAMUSCULAR | Status: DC | PRN

## 2012-11-20 MED ORDER — OXYCODONE HCL 5 MG/5ML PO SOLN: 5.0000 mg | Freq: Once | ORAL | Status: DC | PRN

## 2012-11-20 MED ORDER — MORPHINE SULFATE 2 MG/ML IJ SOLN: 0.5000 mg | INTRAMUSCULAR | Status: DC | PRN

## 2012-11-20 MED ORDER — HYDROMORPHONE HCL PF 1 MG/ML IJ SOLN
0.2500 mg | INTRAMUSCULAR | Status: DC | PRN
Start: 2012-11-20 — End: 2012-11-21
  Administered 2012-11-20: 0.5 mg via INTRAVENOUS

## 2012-11-20 MED ORDER — ENOXAPARIN SODIUM 40 MG/0.4ML ~~LOC~~ SOLN
40.0000 mg | SUBCUTANEOUS | Status: DC
  Administered 2012-11-21 – 2012-11-27 (×7): 40 mg via SUBCUTANEOUS
  Filled 2012-11-20 (×8): qty 0.4

## 2012-11-20 MED ORDER — METHOCARBAMOL 100 MG/ML IJ SOLN
500.0000 mg | Freq: Four times a day (QID) | INTRAVENOUS | Status: DC | PRN
  Filled 2012-11-20: qty 5

## 2012-11-20 MED ORDER — PROMETHAZINE HCL 25 MG/ML IJ SOLN: 6.2500 mg | INTRAMUSCULAR | Status: DC | PRN

## 2012-11-20 MED ORDER — ACETAMINOPHEN 10 MG/ML IV SOLN
INTRAVENOUS | Status: DC | PRN
  Administered 2012-11-20: 1000 mg via INTRAVENOUS

## 2012-11-20 MED ORDER — NEOSTIGMINE METHYLSULFATE 1 MG/ML IJ SOLN
INTRAMUSCULAR | Status: DC | PRN
  Administered 2012-11-20: 3.5 mg via INTRAVENOUS

## 2012-11-20 MED ORDER — MENTHOL 3 MG MT LOZG: 1.0000 | LOZENGE | OROMUCOSAL | Status: DC | PRN

## 2012-11-20 MED ORDER — ROCURONIUM BROMIDE 100 MG/10ML IV SOLN
INTRAVENOUS | Status: DC | PRN
  Administered 2012-11-20: 50 mg via INTRAVENOUS

## 2012-11-20 MED ORDER — ALUM & MAG HYDROXIDE-SIMETH 200-200-20 MG/5ML PO SUSP: 30.0000 mL | ORAL | Status: DC | PRN

## 2012-11-20 MED ORDER — FENTANYL CITRATE 0.05 MG/ML IJ SOLN
INTRAMUSCULAR | Status: DC | PRN
  Administered 2012-11-20: 100 ug via INTRAVENOUS
  Administered 2012-11-20 (×3): 50 ug via INTRAVENOUS

## 2012-11-20 MED ORDER — METHOCARBAMOL 500 MG PO TABS
500.0000 mg | ORAL_TABLET | Freq: Four times a day (QID) | ORAL | Status: DC | PRN
  Administered 2012-11-20 – 2012-11-27 (×18): 500 mg via ORAL
  Filled 2012-11-20 (×17): qty 1

## 2012-11-20 MED ORDER — TEMAZEPAM 15 MG PO CAPS: 15.0000 mg | ORAL_CAPSULE | Freq: Every evening | ORAL | Status: DC | PRN

## 2012-11-20 MED ORDER — GLYCOPYRROLATE 0.2 MG/ML IJ SOLN
INTRAMUSCULAR | Status: DC | PRN
  Administered 2012-11-20: .5 mg via INTRAVENOUS

## 2012-11-20 MED ORDER — LIDOCAINE HCL (CARDIAC) 20 MG/ML IV SOLN
INTRAVENOUS | Status: DC | PRN
  Administered 2012-11-20: 50 mg via INTRAVENOUS

## 2012-11-20 MED ORDER — LACTATED RINGERS IV SOLN
INTRAVENOUS | Status: DC | PRN
  Administered 2012-11-20 (×2): via INTRAVENOUS

## 2012-11-20 MED ORDER — LACTATED RINGERS IV SOLN
INTRAVENOUS | Status: DC
  Administered 2012-11-20: 23:00:00 via INTRAVENOUS

## 2012-11-20 MED ORDER — POLYETHYLENE GLYCOL 3350 17 G PO PACK: 17.0000 g | PACK | Freq: Every day | ORAL | Status: DC | PRN

## 2012-11-20 MED ORDER — METOCLOPRAMIDE HCL 10 MG PO TABS: 5.0000 mg | ORAL_TABLET | Freq: Three times a day (TID) | ORAL | Status: DC | PRN

## 2012-11-20 MED ORDER — PHENOL 1.4 % MT LIQD: 1.0000 | OROMUCOSAL | Status: DC | PRN

## 2012-11-20 MED ORDER — CEFAZOLIN SODIUM-DEXTROSE 2-3 GM-% IV SOLR
INTRAVENOUS | Status: DC | PRN
  Administered 2012-11-20: 2 g via INTRAVENOUS

## 2012-11-20 MED ORDER — OXYCODONE HCL 5 MG PO TABS
5.0000 mg | ORAL_TABLET | Freq: Once | ORAL | Status: DC | PRN
Start: 2012-11-20 — End: 2012-11-20

## 2012-11-20 MED ORDER — HYDROCORTISONE SOD SUCCINATE 1000 MG IJ SOLR
INTRAMUSCULAR | Status: DC | PRN
  Administered 2012-11-20: 100 mg via INTRAVENOUS

## 2012-11-20 MED ORDER — PROPOFOL 10 MG/ML IV BOLUS
INTRAVENOUS | Status: DC | PRN
  Administered 2012-11-20: 150 mg via INTRAVENOUS

## 2012-11-20 SURGICAL SUPPLY — 45 items
BANDAGE GAUZE ELAST BULKY 4 IN (GAUZE/BANDAGES/DRESSINGS) ×2 IMPLANT
BIT DRILL CANN LG 4.3MM (BIT) IMPLANT
BLADE SURG 10 STRL SS (BLADE) ×2 IMPLANT
BNDG COHESIVE 4X5 TAN STRL (GAUZE/BANDAGES/DRESSINGS) ×5 IMPLANT
CLOTH BEACON ORANGE TIMEOUT ST (SAFETY) ×2 IMPLANT
CLSR STERI-STRIP ANTIMIC 1/2X4 (GAUZE/BANDAGES/DRESSINGS) ×2 IMPLANT
COVER SURGICAL LIGHT HANDLE (MISCELLANEOUS) ×2 IMPLANT
DRAPE STERI IOBAN 125X83 (DRAPES) ×2 IMPLANT
DRILL BIT CANN LG 4.3MM (BIT) ×2
DRSG EMULSION OIL 3X3 NADH (GAUZE/BANDAGES/DRESSINGS) ×2 IMPLANT
DRSG MEPILEX BORDER 4X4 (GAUZE/BANDAGES/DRESSINGS) ×2 IMPLANT
DRSG MEPILEX BORDER 4X8 (GAUZE/BANDAGES/DRESSINGS) ×2 IMPLANT
DRSG PAD ABDOMINAL 8X10 ST (GAUZE/BANDAGES/DRESSINGS) ×2 IMPLANT
DURAPREP 26ML APPLICATOR (WOUND CARE) ×2 IMPLANT
ELECT CAUTERY BLADE 6.4 (BLADE) ×2 IMPLANT
ELECT REM PT RETURN 9FT ADLT (ELECTROSURGICAL) ×2
ELECTRODE REM PT RTRN 9FT ADLT (ELECTROSURGICAL) ×1 IMPLANT
GLOVE BIO SURGEON STRL SZ7.5 (GLOVE) ×2 IMPLANT
GLOVE BIO SURGEON STRL SZ8 (GLOVE) ×2 IMPLANT
GLOVE EUDERMIC 7 POWDERFREE (GLOVE) ×2 IMPLANT
GLOVE SS BIOGEL STRL SZ 7.5 (GLOVE) ×1 IMPLANT
GLOVE SUPERSENSE BIOGEL SZ 7.5 (GLOVE) ×1
GOWN STRL NON-REIN LRG LVL3 (GOWN DISPOSABLE) ×2 IMPLANT
GOWN STRL REIN XL XLG (GOWN DISPOSABLE) ×4 IMPLANT
GUIDEPIN 3.2X17.5 THRD DISP (PIN) ×1 IMPLANT
GUIDEWIRE BALL NOSE 100CM (WIRE) ×1 IMPLANT
HFN 125 DEG 11MM X 180MM (Orthopedic Implant) ×1 IMPLANT
KIT BASIN OR (CUSTOM PROCEDURE TRAY) ×2 IMPLANT
KIT ROOM TURNOVER OR (KITS) ×2 IMPLANT
MANIFOLD NEPTUNE II (INSTRUMENTS) ×2 IMPLANT
NS IRRIG 1000ML POUR BTL (IV SOLUTION) ×2 IMPLANT
PACK GENERAL/GYN (CUSTOM PROCEDURE TRAY) ×2 IMPLANT
PAD ARMBOARD 7.5X6 YLW CONV (MISCELLANEOUS) ×4 IMPLANT
SCREW BONE CORTICAL 5.0X3 (Screw) ×1 IMPLANT
SCREW LAG 10.5MMX105MM HFN (Screw) ×1 IMPLANT
STAPLER VISISTAT 35W (STAPLE) ×2 IMPLANT
SUT VIC AB 0 CT1 27 (SUTURE) ×2
SUT VIC AB 0 CT1 27XBRD ANBCTR (SUTURE) ×3 IMPLANT
SUT VIC AB 1 CT1 27 (SUTURE) ×2
SUT VIC AB 1 CT1 27XBRD ANBCTR (SUTURE) ×1 IMPLANT
SUT VIC AB 2-0 CT1 27 (SUTURE) ×2
SUT VIC AB 2-0 CT1 TAPERPNT 27 (SUTURE) ×1 IMPLANT
TOWEL OR 17X24 6PK STRL BLUE (TOWEL DISPOSABLE) ×2 IMPLANT
TOWEL OR 17X26 10 PK STRL BLUE (TOWEL DISPOSABLE) ×2 IMPLANT
WATER STERILE IRR 1000ML POUR (IV SOLUTION) ×3 IMPLANT

## 2012-11-20 NOTE — Progress Notes (Signed)
Orthopedic Tech Progress Note Patient Details:  [name] March 19, 1956 [id number] Applied overhead frame. Patient ID: [name], female   DOB: 04-08-1956, 57 y.o.   MRN: [id number]   [name] 11/20/2012, 10:34 PM

## 2012-11-20 NOTE — Progress Notes (Signed)
UR COMPLETED  

## 2012-11-20 NOTE — Anesthesia Preprocedure Evaluation (Addendum)
Anesthesia Evaluation  Patient identified by MRN, date of birth, ID band Patient awake    Reviewed: Allergy & Precautions, H&P , NPO status , Patient's Chart, lab work & pertinent test results  Airway Mallampati: I TM Distance: >3 FB Neck ROM: Full    Dental  (+) Edentulous Upper, Edentulous Lower and Dental Advisory Given   Pulmonary shortness of breath and with exertion, asthma , resolved,  breath sounds clear to auscultation        Cardiovascular Rhythm:Regular Rate:Normal     Neuro/Psych  Headaches,    GI/Hepatic   Endo/Other    Renal/GU      Musculoskeletal  (+) Arthritis -, Rheumatoid disorders,    Abdominal (+)  Abdomen: soft. Bowel sounds: normal.  Peds  Hematology   Anesthesia Other Findings Chronic steroid usage  Reproductive/Obstetrics                         Anesthesia Physical Anesthesia Plan  ASA: II  Anesthesia Plan: General   Post-op Pain Management:    Induction: Intravenous  Airway Management Planned: Oral ETT  Additional Equipment:   Intra-op Plan:   Post-operative Plan: Extubation in OR  Informed Consent: I have reviewed the patients History and Physical, chart, labs and discussed the procedure including the risks, benefits and alternatives for the proposed anesthesia with the patient or authorized representative who has indicated his/her understanding and acceptance.     Plan Discussed with: CRNA and Surgeon  Anesthesia Plan Comments:         Anesthesia Quick Evaluation

## 2012-11-20 NOTE — Anesthesia Procedure Notes (Signed)
Procedure Name: Intubation Date/Time: 11/20/2012 6:07 PM Performed by: Ellin Goodie Pre-anesthesia Checklist: Patient identified, Emergency Drugs available, Suction available, Patient being monitored and Timeout performed Patient Re-evaluated:Patient Re-evaluated prior to inductionOxygen Delivery Method: Circle system utilized Preoxygenation: Pre-oxygenation with 100% oxygen Intubation Type: IV induction Ventilation: Mask ventilation without difficulty Laryngoscope Size: Mac and 3 Grade View: Grade I Tube type: Oral Tube size: 7.5 mm Number of attempts: 1 Airway Equipment and Method: Stylet Placement Confirmation: ETT inserted through vocal cords under direct vision,  positive ETCO2 and breath sounds checked- equal and bilateral Secured at: 22 cm Tube secured with: Tape Dental Injury: Teeth and Oropharynx as per pre-operative assessment

## 2012-11-20 NOTE — Transfer of Care (Signed)
Immediate Anesthesia Transfer of Care Note  Patient: [name]  Procedure(s) Performed: Procedure(s): right hip trochanteric nail (Right)  Patient Location: PACU  Anesthesia Type:General  Level of Consciousness: awake and sedated  Airway & Oxygen Therapy: Patient Spontanous Breathing and Patient connected to nasal cannula oxygen  Post-op Assessment: Report given to PACU RN and Post -op Vital signs reviewed and stable  Post vital signs: Reviewed and stable  Complications: No apparent anesthesia complications

## 2012-11-20 NOTE — Op Note (Signed)
11/19/2012 - 11/20/2012  7:08 PM  PATIENT:   [name]  57 y.o. female  PRE-OPERATIVE DIAGNOSIS:  Intertrochanteric Right Hip fracture  POST-OPERATIVE DIAGNOSIS:  same  PROCEDURE:  ORIF with IMHS  SURGEON:  Donique Hammonds, Vania Rea. M.D.  ASSISTANTS: Shuford pac   ANESTHESIA:   GET  EBL: 200  SPECIMEN:  none  Drains: none   PATIENT DISPOSITION:  PACU - hemodynamically stable.    PLAN OF CARE: Admit to inpatient   Dictation# [phone] [id number], [phone]

## 2012-11-20 NOTE — Progress Notes (Signed)
[facility] PROGRESS NOTE  [name] JXB:[id number] DOB: [birth year] DOA: 11/19/2012 PCP: Tanya Breach, PA-C  Assessment/Plan: 1. RA: stable. Continue prednisone 2. Right hip fracture: per ortho service. Surgery today in the late afternoon. Will need biphosphonates and evaluation for osteoporosis at discharge due to long term use of steroids. Will check vit D level. 3. Asthma: seasonal. Well control. No SOB and no wheezing currently. 4. GERD: continue PPI.  DVT: heparin  Code Status: full Family Communication: no family at bedside Disposition Plan: surgery on right hip today; then evaluation from PT/OT to determine discharge needs   Consultants:  Dr. Rennis Schultz (orthopedic service)  Procedures:  plan is for right hip surgery (ORIF) later today (4/14)  Antibiotics:  none  HPI/Subjective: Afebrile. Pain present but control  Objective: Filed Vitals:   11/19/12 2200 11/19/12 2215 11/19/12 2312 11/20/12 0623  BP: 129/74 120/75 155/93 111/62  Pulse: 81 72 91 87  Temp:   98.2 F (36.8 C) 98.6 F (37 C)  TempSrc:   Oral Oral  Resp: 13 16 17 16   SpO2: 100% 100% 100% 98%    Intake/Output Summary (Last 24 hours) at 11/20/12 1306 Last data filed at 11/20/12 1308  Gross per 24 hour  Intake      0 ml  Output    650 ml  Net   -650 ml   There were no vitals filed for this visit.  Exam:   General:  Complaining of pain on her right hip and leg, afebrile, no CP or SOB  Cardiovascular: S1 and S2, no rubs or gallops  Respiratory: CTA bilaterally  Abdomen: soft, NT, ND, positive BS  Musculoskeletal: no edema, no cyanosis or clubbing; some joint deformities on her hands from RA; but not joint swelling at this moment. Decrease range of motion and pain on her right hip.  Data Reviewed: Basic Metabolic Panel:  Recent Labs Lab 11/19/12 1900  NA 135  K 4.9  CL 99  CO2 24  GLUCOSE 137*  BUN 20  CREATININE 0.52  CALCIUM 10.3   CBC:  Recent  Labs Lab 11/19/12 1900  WBC 6.5  HGB 13.4  HCT 39.1  MCV 82.5  PLT 232   BNP (last 3 results)  Recent Labs  08/05/12 1340 08/07/12 1357  PROBNP 804.8* 111.0*     Recent Results (from the past 240 hour(s))  MRSA PCR SCREENING     Status: None   Collection Time    11/20/12 12:37 AM      Result Value Range Status   MRSA by PCR NEGATIVE  NEGATIVE Final   Comment:            The GeneXpert MRSA Assay (FDA     approved for NASAL specimens     only), is one component of a     comprehensive MRSA colonization     surveillance program. It is not     intended to diagnose MRSA     infection nor to guide or     monitor treatment for     MRSA infections.     Studies: Dg Chest 1 View  11/19/2012  *RADIOLOGY REPORT*  Clinical Data: Fall.  Hip pain.  CHEST - 1 VIEW  Comparison: PA and lateral chest 11/07/2012.  Findings: Again seen is cardiomegaly without edema.  No pneumothorax or pleural effusion. Remote healed rib fractures are noted.  IMPRESSION: No acute disease.   Original Report Authenticated By: Holley Dexter, M.D.    Dg Hip  Complete Right  11/19/2012  *RADIOLOGY REPORT*  Clinical Data: Fall, right hip pain and bruising.  RIGHT HIP - COMPLETE 2+ VIEW  Comparison: None.  Findings: The patient has an acute right intertrochanteric fracture.  No other focal bony or joint abnormality is identified.  IMPRESSION: Acute right intertrochanteric fracture.   Original Report Authenticated By: Holley Dexter, M.D.     Scheduled Meds: . aspirin EC  325 mg Oral Daily  . docusate sodium  100 mg Oral BID  . folic acid  1 mg Oral Daily  . heparin  5,000 Units Subcutaneous Q8H  . pantoprazole  40 mg Oral QAC breakfast  . potassium chloride SA  20 mEq Oral Daily  . predniSONE  10 mg Oral Daily   Continuous Infusions: . dextrose 5 % and 0.9% NaCl 100 mL/hr at 11/20/12 0025    Principal Problem:   Hip fracture, right Active Problems:   Rheumatoid arthritis   Chronic use of  steroids    Time spent: >30 minutes    [name]  [facility] Pager [phone]. If 7PM-7AM, please contact night-coverage at [email or web address], password [facility] 11/20/2012, 1:06 PM  LOS: 1 day

## 2012-11-20 NOTE — Anesthesia Postprocedure Evaluation (Signed)
  Anesthesia Post-op Note  Patient: [name]  Procedure(s) Performed: Procedure(s): right hip trochanteric nail (Right)  Patient Location: PACU  Anesthesia Type:General  Level of Consciousness: awake, alert  and oriented  Airway and Oxygen Therapy: Patient Spontanous Breathing and Patient connected to nasal cannula oxygen  Post-op Pain: mild  Post-op Assessment: Post-op Vital signs reviewed  Post-op Vital Signs: Reviewed  Complications: No apparent anesthesia complications

## 2012-11-21 ENCOUNTER — Encounter (HOSPITAL_COMMUNITY): Payer: Self-pay | Admitting: Orthopedic Surgery

## 2012-11-21 DIAGNOSIS — S72009D Fracture of unspecified part of neck of unspecified femur, subsequent encounter for closed fracture with routine healing: Secondary | ICD-10-CM

## 2012-11-21 LAB — BASIC METABOLIC PANEL
BUN: 9 mg/dL (ref 6–23)
CO2: 29 mEq/L (ref 19–32)
Chloride: 103 mEq/L (ref 96–112)
Glucose, Bld: 108 mg/dL — ABNORMAL HIGH (ref 70–99)
Potassium: 3.9 mEq/L (ref 3.5–5.1)

## 2012-11-21 LAB — CBC
HCT: 34.8 % — ABNORMAL LOW (ref 36.0–46.0)
Hemoglobin: 11.7 g/dL — ABNORMAL LOW (ref 12.0–15.0)
MCHC: 33.6 g/dL (ref 30.0–36.0)
WBC: 8.8 10*3/uL (ref 4.0–10.5)

## 2012-11-21 NOTE — Op Note (Signed)
[name], [name]                ACCOUNT NO.:  [id number]  MEDICAL RECORD NO.:  [id number]  LOCATION:  5N10C                        FACILITY:  [facility]  PHYSICIAN:  Vania Rea. Daley Mooradian, M.D.  DATE OF BIRTH:  Mar 28, 1956  DATE OF PROCEDURE: DATE OF DISCHARGE:                              OPERATIVE REPORT   ADDENDUM:  French Ana A. Shuford, P.A.-C was used as an Geophysicist/field seismologist throughout this case, essential for help with positioning of the extremity, retraction, management of the implantation hardware, wound closure, and intraoperative decision making.     Vania Rea. Kemarion Abbey, M.D.     KMS/MEDQ  D:  11/20/2012  T:  11/21/2012  Job:  [id number]

## 2012-11-21 NOTE — Progress Notes (Signed)
Occupational Therapy Evaluation Patient Details Name: [name] MRN: [id number] DOB: Feb 08, 1956 Today's Date: 11/21/2012 Time: 7829-5621 OT Time Calculation (min): 24 min  OT Assessment / Plan / Recommendation Clinical Impression  57 yo s/p fall while palying soccer with daughter , suffering R hip fracture. Underwent ORIF.  WBAT.Pt will benefit from skilled OT services to facilitate D/C to next venue due to below deficits.If pt progresses, she may be able to D/C home with [facility]; however, feel pt may benefit from short term rehab at [facility].    OT Assessment  Patient needs continued OT Services    Follow Up Recommendations  Home health OT;Supervision - Intermittent    Barriers to Discharge Decreased caregiver support    Equipment Recommendations  3 in 1 bedside comode;Tub/shower bench    Recommendations for Other Services    Frequency  Min 2X/week    Precautions / Restrictions Precautions Precautions: Fall Restrictions RLE Weight Bearing: Weight bearing as tolerated   Pertinent Vitals/Pain no apparent distress     ADL  Eating/Feeding: Independent Where Assessed - Eating/Feeding: Chair Grooming: Set up Where Assessed - Grooming: Unsupported sitting Upper Body Bathing: Set up Where Assessed - Upper Body Bathing: Unsupported sitting Lower Body Bathing: Minimal assistance Where Assessed - Lower Body Bathing: Supported sit to stand Upper Body Dressing: Set up Where Assessed - Upper Body Dressing: Unsupported sitting Lower Body Dressing: Minimal assistance Where Assessed - Lower Body Dressing: Supported sit to stand Toilet Transfer: Minimal assistance Toilet Transfer Method: Surveyor, minerals: Bedside commode Toileting - Architect and Hygiene: Min guard Where Assessed - Engineer, mining and Hygiene: Sit to stand from 3-in-1 or toilet Equipment Used: Gait belt Transfers/Ambulation Related to ADLs: min A ADL Comments: Will  benefit from AE    OT Diagnosis: Generalized weakness;Acute pain  OT Problem List: Decreased strength;Decreased range of motion;Decreased activity tolerance;Impaired balance (sitting and/or standing);Decreased safety awareness;Decreased knowledge of use of DME or AE;Decreased knowledge of precautions;Pain OT Treatment Interventions: Self-care/ADL training;Therapeutic exercise;DME and/or AE instruction;Therapeutic activities;Patient/family education   OT Goals Acute Rehab OT Goals OT Goal Formulation: With patient Time For Goal Achievement: 12/05/12 Potential to Achieve Goals: Good ADL Goals Pt Will Perform Lower Body Bathing: with supervision;with caregiver independent in assisting;Sit to stand from chair;Unsupported;with adaptive equipment ADL Goal: Lower Body Bathing - Progress: Goal set today Pt Will Perform Lower Body Dressing: with supervision;with caregiver independent in assisting;Sit to stand from chair;Unsupported;with adaptive equipment ADL Goal: Lower Body Dressing - Progress: Goal set today Pt Will Transfer to Toilet: with supervision;with DME;3-in-1;Drop arm 3-in-1 ADL Goal: Toilet Transfer - Progress: Goal set today Pt Will Perform Toileting - Clothing Manipulation: with modified independence;Sitting on 3-in-1 or toilet ADL Goal: Toileting - Clothing Manipulation - Progress: Goal set today Pt Will Perform Tub/Shower Transfer: with min assist;with caregiver independent in assisting;with DME;Shower seat without back ADL Goal: Web designer - Progress: Goal set today  Visit Information  Last OT Received On: 11/21/12 Assistance Needed: +1    Subjective Data      Prior Functioning     Home Living Lives With: Daughter Available Help at Discharge: Family;Friend(s);Available PRN/intermittently Type of Home: House Home Access: Stairs to enter [employer] of Steps: 4 Entrance Stairs-Rails: Can reach both Home Layout: Two level Alternate Level  Stairs-Number of Steps: 12 Alternate Level Stairs-Rails: None Bathroom Shower/Tub: Engineer, manufacturing systems: Standard Bathroom Accessibility:  (unsure) Home Adaptive Equipment: None Prior Function Level of Independence: Independent Able to Take Stairs?: Yes Driving:  Yes Communication Communication: No difficulties Dominant Hand: Right         Vision/Perception Vision - History Baseline Vision: No visual deficits   Cognition  Cognition Overall Cognitive Status: Appears within functional limits for tasks assessed/performed Arousal/Alertness: Awake/alert Orientation Level: Appears intact for tasks assessed Behavior During Session: [facility] for tasks performed Cognition - Other Comments: unsure of baseline cognitive level    Extremity/Trunk Assessment Right Upper Extremity Assessment RUE ROM/Strength/Tone: Within functional levels RUE Sensation: WFL - Light Touch;WFL - Proprioception RUE Coordination: WFL - gross/fine motor Left Upper Extremity Assessment LUE ROM/Strength/Tone: Within functional levels LUE Sensation: WFL - Light Touch;WFL - Proprioception LUE Coordination: WFL - gross/fine motor Right Lower Extremity Assessment RLE ROM/Strength/Tone: Deficits;Due to pain;Unable to fully assess;Due to precautions RLE ROM/Strength/Tone Deficits: decreased hip and knee AROM and PROM due to pain RLE Sensation: WFL - Light Touch Left Lower Extremity Assessment LLE ROM/Strength/Tone: WFL for tasks assessed LLE Sensation: WFL - Proprioception;WFL - Light Touch LLE Coordination: WFL - gross/fine motor Trunk Assessment Trunk Assessment: Normal     Mobility Bed Mobility Bed Mobility: Supine to Sit Supine to Sit: 5: Supervision Details for Bed Mobility Assistance: Pt sliding leg off bed Transfers Transfers: Sit to Stand;Stand to Sit Sit to Stand: 4: Min assist;From bed Stand to Sit: 4: Min assist;To chair/3-in-1 Details for Transfer Assistance: cues for U and LE placement      Exercise     Balance     End of Session OT - End of Session Equipment Utilized During Treatment: Gait belt Activity Tolerance: Patient tolerated treatment well Patient left: in chair;with call bell/phone within reach Nurse Communication: Mobility status;Patient requests pain meds  GO     Ellee Wawrzyniak,HILLARY 11/21/2012, 5:36 PM [facility], OTR/L  [phone] 11/21/2012

## 2012-11-21 NOTE — Progress Notes (Signed)
[name]  MRN: [id number] DOB/Age: 11/18/1955 57 y.o. Physician: Jacquelyne Balint Procedure: Procedure(s) (LRB): right hip trochanteric nail (Right)     Subjective: Up in chair. Pain as expected.   Vital Signs Temp:  [98.6 F (37 C)-99.3 F (37.4 C)] 98.6 F (37 C) (04/15 0602) Pulse Rate:  [65-92] 85 (04/15 0602) Resp:  [11-18] 18 (04/15 0602) BP: (118-153)/(70-81) 121/74 mmHg (04/15 0602) SpO2:  [95 %-100 %] 100 % (04/15 0602)  Lab Results  Recent Labs  11/20/12 2156 11/21/12 0558  WBC 9.9 8.8  HGB 11.6* 11.7*  HCT 34.7* 34.8*  PLT 189 179   BMET  Recent Labs  11/19/12 1900 11/20/12 2156 11/21/12 0558  NA 135  --  139  K 4.9  --  3.9  CL 99  --  103  CO2 24  --  29  GLUCOSE 137*  --  108*  BUN 20  --  9  CREATININE 0.52 0.49* 0.48*  CALCIUM 10.3  --  9.4   INR  Date Value Range Status  08/05/2012 1.19  0.00 - 1.49 Final     Exam Right hip dressing dry. Thigh soft. NVI RUE        Plan PT/OT for WBAT to R hip Lovenox while inpatient then ASA daily at DC Will follow  [facility] for Dr.Kevin Supple 11/21/2012, 9:05 AM

## 2012-11-21 NOTE — Care Management Note (Signed)
CARE MANAGEMENT NOTE 11/21/2012  Patient:  [name], [name]   Account Number:  [id number]  Date Initiated:  11/21/2012  Documentation initiated by:  Vance Peper  Subjective/Objective Assessment:   57 yr old female s/p left hip IM nailing.     Action/Plan:   CM spoke with patient concerning home health and DME needs at discharge. Patient states she isnt sure if she is going home. Has a 34yrold daughter @ home. SW consulted re: possible SNF.  CM will follow.

## 2012-11-21 NOTE — Progress Notes (Signed)
[facility] PROGRESS NOTE  [name] WUJ:[id number] DOB: 10-22-1955 DOA: 11/19/2012 PCP: Ernst Breach, PA-C  Assessment/Plan: 1. RA: stable. Continue prednisone 2. Right hip fracture: per ortho service. S/P right hip trochanteric nail placement. Continue PRN analgesics and PT rehab. Will need SNF 3. Asthma: seasonal. Well control. No SOB and no wheezing currently. 4. GERD: continue PPI. 5. Chronic use of steroids: will need biphosphonate prescription and Dexa scan at discharge. Follow Vit D level.  DVT: lovenox while inpatient and per Ortho rec's; plan is for full dose ASA at discharge.  Code Status: full Family Communication: no family at bedside Disposition Plan: Per PT rec's will need SNF   Consultants:  Dr. Rennis Chris (orthopedic service)  Procedures:  plan is for right hip surgery (ORIF) later today (4/14)  Antibiotics:  none  HPI/Subjective: Afebrile. Complaining of pain on hher right hip  Objective: Filed Vitals:   11/20/12 2030 11/21/12 0156 11/21/12 0602 11/21/12 0800  BP: 132/81 118/72 121/74   Pulse: 78 92 85   Temp: 98.6 F (37 C) 98.8 F (37.1 C) 98.6 F (37 C)   TempSrc:  Oral Oral   Resp: 14 18 18 18   SpO2: 100% 100% 100%     Intake/Output Summary (Last 24 hours) at 11/21/12 1320 Last data filed at 11/21/12 0900  Gross per 24 hour  Intake 1382.5 ml  Output   3650 ml  Net -2267.5 ml   There were no vitals filed for this visit.  Exam:   General:  Complaining of pain on her right hip and leg, afebrile, no CP or SOB  Cardiovascular: S1 and S2, no rubs or gallops  Respiratory: CTA bilaterally  Abdomen: soft, NT, ND, positive BS  Musculoskeletal: no edema, no cyanosis or clubbing; some joint deformities on her hands from RA; but not joint swelling at this moment. Decrease range of motion and pain on her right hip continues.  Data Reviewed: Basic Metabolic Panel:  Recent Labs Lab 11/19/12 1900 11/20/12 2156  11/21/12 0558  NA 135  --  139  K 4.9  --  3.9  CL 99  --  103  CO2 24  --  29  GLUCOSE 137*  --  108*  BUN 20  --  9  CREATININE 0.52 0.49* 0.48*  CALCIUM 10.3  --  9.4   CBC:  Recent Labs Lab 11/19/12 1900 11/20/12 2156 11/21/12 0558  WBC 6.5 9.9 8.8  HGB 13.4 11.6* 11.7*  HCT 39.1 34.7* 34.8*  MCV 82.5 84.8 85.1  PLT 232 189 179   BNP (last 3 results)  Recent Labs  08/05/12 1340 08/07/12 1357  PROBNP 804.8* 111.0*     Recent Results (from the past 240 hour(s))  MRSA PCR SCREENING     Status: None   Collection Time    11/20/12 12:37 AM      Result Value Range Status   MRSA by PCR NEGATIVE  NEGATIVE Final   Comment:            The GeneXpert MRSA Assay (FDA     approved for NASAL specimens     only), is one component of a     comprehensive MRSA colonization     surveillance program. It is not     intended to diagnose MRSA     infection nor to guide or     monitor treatment for     MRSA infections.     Studies: Dg Chest 1 View  11/19/2012  *  RADIOLOGY REPORT*  Clinical Data: Fall.  Hip pain.  CHEST - 1 VIEW  Comparison: PA and lateral chest 11/07/2012.  Findings: Again seen is cardiomegaly without edema.  No pneumothorax or pleural effusion. Remote healed rib fractures are noted.  IMPRESSION: No acute disease.   Original Report Authenticated By: Holley Dexter, M.D.    Dg Hip Complete Right  11/19/2012  *RADIOLOGY REPORT*  Clinical Data: Fall, right hip pain and bruising.  RIGHT HIP - COMPLETE 2+ VIEW  Comparison: None.  Findings: The patient has an acute right intertrochanteric fracture.  No other focal bony or joint abnormality is identified.  IMPRESSION: Acute right intertrochanteric fracture.   Original Report Authenticated By: Holley Dexter, M.D.    Dg Hip Operative Right  11/20/2012  *RADIOLOGY REPORT*  Clinical Data: Right femur fracture.  DG OPERATIVE RIGHT HIP  Comparison: Radiographs dated 11/19/2012  Findings: AP and lateral C-arm images  demonstrate the patient has undergone open reduction and internal fixation of the intertrochanteric fracture of the proximal right femur.  Alignment and position of the fracture fragments is near anatomic. Compression screw and intramedullary rod are in place with fixation screw.  IMPRESSION: Open reduction and internal fixation of the right femoral fracture.   Original Report Authenticated By: Francene Boyers, M.D.     Scheduled Meds: . docusate sodium  100 mg Oral BID  . enoxaparin (LOVENOX) injection  40 mg Subcutaneous Q24H  . folic acid  1 mg Oral Daily  . pantoprazole  40 mg Oral QAC breakfast  . potassium chloride SA  20 mEq Oral Daily  . predniSONE  10 mg Oral Daily   Continuous Infusions:    Principal Problem:   Hip fracture, right Active Problems:   Rheumatoid arthritis   Chronic use of steroids    Time spent: >30 minutes    [name]  [facility] Pager [phone]. If 7PM-7AM, please contact night-coverage at [email or web address], password [facility] 11/21/2012, 1:20 PM  LOS: 2 days

## 2012-11-21 NOTE — Op Note (Signed)
[name], [name]                ACCOUNT NO.:  [id number]  MEDICAL RECORD NO.:  [id number]  LOCATION:  5N10C                        FACILITY:  [facility]  PHYSICIAN:  Vania Rea. Kambri Dismore, M.D.  DATE OF BIRTH:  08-20-1955  DATE OF PROCEDURE:  11/20/2012 DATE OF DISCHARGE:                              OPERATIVE REPORT   PREOPERATIVE DIAGNOSIS:  Displaced right intertrochanteric hip fracture.  POSTOPERATIVE DIAGNOSIS:  Displaced right intertrochanteric hip fracture.  PROCEDURE:  Open reduction and internal fixation of right intertrochanteric hip fracture utilizing intramedullary hip screw 125- degree angle, statically locked.  SURGEON:  Vania Rea. Jahziah Simonin, M.D.  Threasa HeadsFrench Ana A. Shuford, P.A.-C.  ANESTHESIA:  General endotracheal.  ESTIMATED BLOOD LOSS:  200 mL.  DRAINS:  None.  HISTORY:  [name] is a 57 year old female who fell yesterday injuring her right hip with immediate complaints of pain and inability to bear weight.  She was brought to the [facility] Emergency Room where on examination, she was found to have diffuse tenderness with palpation about the right hip, but was neurovascularly intact distally.  Plain radiographs were obtained showing a displaced right intertrochanteric hip fracture.  She was subsequently admitted to the Hospital Service and now brought to the operating room for planned operative stabilization of her right hip fracture.  I preoperatively counseled [name] on treatment options as well as risks versus benefits thereof.  Possible surgical complications were reviewed including potential for bleeding, infection, neurovascular injury, malunion, nonunion, loss of fixation, and the potential increased risk of perioperative morbidity and/or mortality considering her history of rheumatoid arthritis on chronic prednisone and __________ osteoporosis as well as potential for DVT and PE.  She understands and accepts and agrees to planned  procedure.  PROCEDURE IN DETAIL:  After undergoing routine preop evaluation, patient brought to the operating room, placed supine on the operating table, underwent smooth induction of a general endotracheal anesthesia.  Placed on the fracture table in the supine position.  The right leg was placed in longitudinal traction.  Left leg placed in a well leg holder. Appropriately padded and protected.  Fluoroscopic images were then used to confirm proper alignment.  We did reduction maneuver to the right hip to gain preoperative good positioning.  Right hip girdle region was then sterilely prepped and draped in standard fashion, confirmed that she had received prophylactic antibiotics.  Time-out was called.  A 3 cm incision was then made just proximal to the tip of the greater trochanter.  A sharp incision carried down through skin, subcu, and deep fascia.  The greater trochanter was then palpated.  Starting awl was then introduced and proper positioning confirmed on AP and lateral fluoroscopic images.  The starting awl was then directed into the proximal femur and a ball-tip guidewire was directed down the femoral shaft.  We then drilled with a starting reamer to the appropriate depth. We then passed a 125 degree side angle intramedullary hip nail of the [employer] over the guidewire into the proximal femur to the appropriate depth.  The guidewire was removed.  Using the outrigger guide, we then directed a guide pin up into the femoral neck and head using the standard  guide sleeve.  Proper positioning confirmed fluoroscopically.  This was then drilled and a 105 mm lag screw was directed into the femoral neck and head with proper positioning confirmed fluoroscopically.  We placed this into the dynamic mode.  We then used the outrigger guide to place a single static distal locking screw.  Proper positioning confirmed fluoroscopically.  The out rigger guide was then removed.  Final  fluoroscopic images showed good position of the hardware and good alignment of the fracture site.  All wounds were irrigated, closed with 2-0 Vicryl at subcu layer and intracuticular 3-0 Monocryl for the skin followed by Steri-Strips.  Dry dressing was taped of the incisions.  The patient was then removed from traction, placed supine on the operating table, wakened, extubated and taken to recovery room in stable condition.     Vania Rea. Nakya Weyand, M.D.     KMS/MEDQ  D:  11/20/2012  T:  11/21/2012  Job:  [id number]

## 2012-11-21 NOTE — Evaluation (Signed)
Physical Therapy Evaluation Patient Details Name: [name] MRN: [id number] DOB: Feb 09, 1956 Today's Date: 11/21/2012 Time: 1010-1035 PT Time Calculation (min): 25 min  PT Assessment / Plan / Recommendation Clinical Impression  Pt s/p right hip trochanteric nail , limited by pain and anxiety.  Pt will benefit from acute PT services to increase independence with functional mobility, gait and balance.    PT Assessment  Patient needs continued PT services    Follow Up Recommendations  SNF    Does the patient have the potential to tolerate intense rehabilitation      Barriers to Discharge Inaccessible home environment;Decreased caregiver support Pt reports she has no one to help at home and has stairs to enter home as well as within home    Equipment Recommendations  Rolling walker with 5" wheels;Wheelchair (measurements PT)    Recommendations for Other Services OT consult   Frequency Min 5X/week    Precautions / Restrictions Restrictions Weight Bearing Restrictions: Yes RLE Weight Bearing: Weight bearing as tolerated   Pertinent Vitals/Pain Pt c/o pain 7/10 with all activities, repositioned and RN made aware.      Mobility  Transfers Transfers: Sit to Stand;Stand to Sit Sit to Stand: 3: Mod assist Stand to Sit: 3: Mod assist Details for Transfer Assistance: Pt required cues for UE and LE placement, sit to stand from recliner to RW.  Requires increased time and encouragement due to pain and anxiety Ambulation/Gait Ambulation/Gait Assistance: 3: Mod assist Ambulation Distance (Feet): 5 Feet Assistive device: Rolling walker Ambulation/Gait Assistance Details: manual facilitation and cues at hips, cues to put wt through UEs onto RW to decrease wt on R LE, pt limited by pain and anxiety Gait Pattern: Step-to pattern Stairs: No Wheelchair Mobility Wheelchair Mobility: No    Exercises General Exercises - Lower Extremity Ankle Circles/Pumps: AROM;15 reps;Both Quad Sets:  AROM;10 reps;Right Gluteal Sets: Both;15 reps;AROM   PT Diagnosis: Difficulty walking;Generalized weakness;Acute pain  PT Problem List: Decreased strength;Decreased mobility;Decreased activity tolerance;Decreased range of motion;Decreased balance;Decreased knowledge of use of DME;Pain PT Treatment Interventions: DME instruction;Gait training;Stair training;Functional mobility training;Therapeutic activities;Patient/family education;Balance training;Therapeutic exercise;Wheelchair mobility training;Manual techniques;Modalities   PT Goals Acute Rehab PT Goals PT Goal Formulation: With patient Pt will go Supine/Side to Sit: with modified independence Pt will Transfer Bed to Chair/Chair to Bed: with modified independence Pt will Ambulate: 16 - 50 feet;with modified independence Pt will Go Up / Down Stairs: Flight;with modified independence Pt will Perform Home Exercise Program: Independently  Visit Information  Last PT Received On: 11/21/12 Assistance Needed: +1    Subjective Data  Subjective: I'm determined Patient Stated Goal: Go home and walk   Prior Functioning  Home Living Lives With: Daughter Available Help at Discharge: Family;Friend(s);Available PRN/intermittently Type of Home: House Home Access: Stairs to enter [employer] of Steps: 4 Entrance Stairs-Rails: Can reach both Home Layout: Two level Alternate Level Stairs-Number of Steps: 12 Alternate Level Stairs-Rails: None Prior Function Level of Independence: Independent Able to Take Stairs?: Yes Driving: Yes Communication Communication: No difficulties    Cognition  Cognition Overall Cognitive Status: Appears within functional limits for tasks assessed/performed Behavior During Session: [facility] for tasks performed    Extremity/Trunk Assessment Right Lower Extremity Assessment RLE ROM/Strength/Tone: Due to pain;Deficits RLE ROM/Strength/Tone Deficits: decreased hip and knee AROM and PROM due to pain RLE  Sensation: WFL - Light Touch RLE Coordination: WFL - gross/fine motor Left Lower Extremity Assessment LLE ROM/Strength/Tone: Within functional levels LLE Sensation: WFL - Light Touch LLE Coordination: WFL -  gross/fine motor Trunk Assessment Trunk Assessment: Normal   Balance    End of Session PT - End of Session Equipment Utilized During Treatment: Gait belt Activity Tolerance: Patient limited by pain Patient left: in chair;with call bell/phone within reach Nurse Communication: Mobility status;Patient requests pain meds  GP     Victoria Henshaw 11/21/2012, 10:50 AM

## 2012-11-22 LAB — CBC
HCT: 34.1 % — ABNORMAL LOW (ref 36.0–46.0)
Hemoglobin: 11.4 g/dL — ABNORMAL LOW (ref 12.0–15.0)
MCH: 28.4 pg (ref 26.0–34.0)
MCHC: 33.4 g/dL (ref 30.0–36.0)
MCV: 84.8 fL (ref 78.0–100.0)
RDW: 16.3 % — ABNORMAL HIGH (ref 11.5–15.5)

## 2012-11-22 MED ORDER — POLYETHYLENE GLYCOL 3350 17 G PO PACK
17.0000 g | PACK | Freq: Two times a day (BID) | ORAL | Status: DC
  Administered 2012-11-22 – 2012-11-25 (×8): 17 g via ORAL
  Filled 2012-11-22 (×13): qty 1

## 2012-11-22 MED ORDER — CALCIUM CARBONATE-VITAMIN D 500-200 MG-UNIT PO TABS
2.0000 | ORAL_TABLET | Freq: Two times a day (BID) | ORAL | Status: DC
  Administered 2012-11-22 – 2012-11-27 (×11): 2 via ORAL
  Filled 2012-11-22 (×13): qty 2

## 2012-11-22 MED ORDER — OXYCODONE HCL 5 MG PO TABS
10.0000 mg | ORAL_TABLET | ORAL | Status: DC | PRN
  Administered 2012-11-22 – 2012-11-23 (×6): 10 mg via ORAL
  Filled 2012-11-22 (×6): qty 2

## 2012-11-22 MED ORDER — HYDROMORPHONE HCL PF 1 MG/ML IJ SOLN
1.0000 mg | INTRAMUSCULAR | Status: DC | PRN
  Administered 2012-11-22: 1 mg via INTRAVENOUS
  Filled 2012-11-22: qty 1

## 2012-11-22 NOTE — Progress Notes (Signed)
Occupational Therapy Treatment Patient Details Name: [name] MRN: [id number] DOB: 07/16/1956 Today's Date: 11/22/2012 Time: 4098-1191 OT Time Calculation (min): 21 min  OT Assessment / Plan / Recommendation Comments on Treatment Session Pt making progress and should continue with acute OT services to maximize level of function and safety. Pt would like to return home with [facility] therapy, however, uncertain if she will be able to have 24 hour assist/sup needed. Discussed options with pt for furhter therpay needs after acute d/c    Follow Up Recommendations  SNF;Supervision/Assistance - 24 hour;Supervision - Intermittent;Other (comment) (HH depending on progress and if adequate support at home)    Barriers to Discharge   Uncertain if will have assist/supervision needed at home    Equipment Recommendations  Other (comment) (ADL A/E)    Recommendations for Other Services    Frequency Min 2X/week   Plan Discharge plan remains appropriate    Precautions / Restrictions Precautions Precautions: Fall Restrictions Weight Bearing Restrictions: Yes RLE Weight Bearing: Weight bearing as tolerated   Pertinent Vitals/Pain 8/10 R LE    ADL  Grooming: Performed;Min guard;Wash/dry face;Wash/dry hands Where Assessed - Grooming: Supported standing Toilet Transfer: Performed;Min Pension scheme manager Method: Sit to Barista: Raised toilet seat with arms (or 3-in-1 over toilet) Toileting - Clothing Manipulation and Hygiene: Performed;Min guard Where Assessed - Engineer, mining and Hygiene: Standing Equipment Used: Gait belt;Rolling walker Transfers/Ambulation Related to ADLs: Pt required verbal cues for correct hand placement and to step closer in toward sink with RW when standng at sink for grooming/hygiene ADL Comments: pt required 3 rest breaks during tx session, required increased amount of time to complete    OT Diagnosis:    OT Problem List:   OT  Treatment Interventions:     OT Goals ADL Goals ADL Goal: Toilet Transfer - Progress: Progressing toward goals ADL Goal: Toileting - Clothing Manipulation - Progress: Progressing toward goals  Visit Information  Last OT Received On: 11/22/12 Assistance Needed: +1    Subjective Data  Subjective: " It takes me a minute to get it together " Patient Stated Goal: To return home   Prior Functioning       Cognition  Cognition Arousal/Alertness: Awake/alert Behavior During Therapy: WFL for tasks assessed/performed Overall Cognitive Status: Within Functional Limits for tasks assessed    Mobility  Bed Mobility Bed Mobility: Supine to Sit;Sitting - Scoot to Edge of Bed;Sit to Supine Supine to Sit: 5: Supervision Sitting - Scoot to Edge of Bed: 5: Supervision Sit to Supine: 4: Min assist Details for Bed Mobility Assistance: assist with R LE onto bed Transfers Transfers: Sit to Stand;Stand to Sit Sit to Stand: 4: Min guard;With upper extremity assist;From chair/3-in-1 Stand to Sit: 4: Min guard;With upper extremity assist;To chair/3-in-1 Details for Transfer Assistance: Cues for safe hand placement and technique. Pt picking up RW instead of pushing    Exercises      Balance Balance Balance Assessed: No   End of Session OT - End of Session Equipment Utilized During Treatment: Gait belt;Other (comment) (RW, 3 in 1) Activity Tolerance: Patient limited by pain;Patient limited by fatigue Patient left: in bed;with call bell/phone within reach  GO     Galen Manila 11/22/2012, 3:33 PM

## 2012-11-22 NOTE — Clinical Social Work Placement (Signed)
Clinical Social Work Department  CLINICAL SOCIAL WORK PLACEMENT NOTE  11/22/2012  Patient: [name] Account Number: [id number]  Admit date: 11/19/12  Clinical Social Worker: Sabino Niemann LCSWA Date/time: 11/22/2012 10:30 AM  Clinical Social Work is seeking post-discharge placement for this patient at the following level of care: SKILLED NURSING (*CSW will update this form in Epic as items are completed)  11/22/2012 Patient/family provided with [facility] Department of Clinical Social Work's list of facilities offering this level of care within the geographic area requested by the patient (or if unable, by the patient's family).  11/22/2012 Patient/family informed of their freedom to choose among providers that offer the needed level of care, that participate in Medicare, Medicaid or managed care program needed by the patient, have an available bed and are willing to accept the patient.  4/16/2014Patient/family informed of [facility]' ownership interest in [facility], as well as of the fact that they are under no obligation to receive care at this facility.  PASARR submitted to EDS on  PASARR number received from EDS on  FL2 transmitted to all facilities in geographic area requested by pt/family on 11/22/2012  FL2 transmitted to all facilities within larger geographic area on  Patient informed that his/her managed care company has contracts with or will negotiate with certain facilities, including the following:  Patient/family informed of bed offers received:  Patient chooses bed at  Physician recommends and patient chooses bed at  Patient to be transferred to on   Patient to be transferred to facility by  The following physician request were entered in Epic:  Additional Comments:

## 2012-11-22 NOTE — Progress Notes (Signed)
Physical Therapy Treatment Patient Details Name: [name] MRN: [id number] DOB: October 16, 1955 Today's Date: 11/22/2012 Time: 4010-2725 PT Time Calculation (min): 19 min  PT Assessment / Plan / Recommendation Comments on Treatment Session  Patient s/p R hip ORIF. Limited with mobility due to pain and anxiety. Able to progress some with mobility this morning but pain still limiting overall factor. Continue to recommend STSNF at this time    Follow Up Recommendations  SNF     Does the patient have the potential to tolerate intense rehabilitation     Barriers to Discharge        Equipment Recommendations  Rolling walker with 5" wheels;Wheelchair (measurements PT)    Recommendations for Other Services OT consult  Frequency Min 5X/week   Plan Discharge plan remains appropriate;Frequency remains appropriate    Precautions / Restrictions Precautions Precautions: Fall Restrictions RLE Weight Bearing: Weight bearing as tolerated   Pertinent Vitals/Pain     Mobility  Bed Mobility Supine to Sit: 5: Supervision Details for Bed Mobility Assistance: Pt sliding leg off bed Transfers Sit to Stand: 4: Min guard;With upper extremity assist;From chair/3-in-1 Stand to Sit: 4: Min guard;With upper extremity assist;To chair/3-in-1 Details for Transfer Assistance: Cues for safe hand placement and technique Ambulation/Gait Ambulation/Gait Assistance: 4: Min assist Ambulation Distance (Feet): 15 Feet Assistive device: Rolling walker Ambulation/Gait Assistance Details: Cues for posture and to increase weight through LEs and she is relying heavily on Rw Gait Pattern: Step-to pattern    Exercises Total Joint Exercises Long Arc Quad: AROM;Right;10 reps   PT Diagnosis:    PT Problem List:   PT Treatment Interventions:     PT Goals Acute Rehab PT Goals PT Goal: Supine/Side to Sit - Progress: Progressing toward goal PT Transfer Goal: Bed to Chair/Chair to Bed - Progress: Progressing toward  goal PT Goal: Ambulate - Progress: Progressing toward goal PT Goal: Perform Home Exercise Program - Progress: Progressing toward goal  Visit Information  Last PT Received On: 11/22/12 Assistance Needed: +1    Subjective Data      Cognition  Cognition Arousal/Alertness: Awake/alert Behavior During Therapy: WFL for tasks assessed/performed Overall Cognitive Status: Within Functional Limits for tasks assessed    Balance     End of Session PT - End of Session Equipment Utilized During Treatment: Gait belt Activity Tolerance: Patient limited by pain Patient left: in chair;with call bell/phone within reach Nurse Communication: Mobility status;Patient requests pain meds   GP     Fredrich Birks 11/22/2012, 11:06 AM 11/22/2012 Fredrich Birks PTA [phone] pager [phone] office

## 2012-11-22 NOTE — ED Provider Notes (Signed)
History     57 year old female with right hip pain. Acute onset while playing grass with her daughter. Persistent pain since then. Worse with attempted movement. Inability to ambulate. Happened shortly before arrival. No numbness or tingling. Denies pain anywhere else. Past history of asthma.  CSN: [id number]  Arrival date & time 11/19/12  1843   First MD Initiated Contact with Patient 11/19/12 1906      Chief Complaint  Patient presents with  . Fall    (Consider location/radiation/quality/duration/timing/severity/associated sxs/prior treatment) HPI  Past Medical History  Diagnosis Date  . Asthma   . Headache   . Pneumonia 07/2012  . Wears hearing aid   . History of blood transfusion 2008    Past Surgical History  Procedure Laterality Date  . Bilateral cataract surgery Bilateral   . Abdominal hysterectomy      total, due to fibroid  . Cataract extraction    . Compression hip screw Right 11/20/2012    Procedure: right hip trochanteric nail;  Surgeon: Senaida Lange, MD;  Location: [facility] OR;  Service: Orthopedics;  Laterality: Right;    No family history on file.  History  Substance Use Topics  . Smoking status: Former Smoker -- 0.25 packs/day for 20 years    Quit date: 10/02/2000  . Smokeless tobacco: Not on file  . Alcohol Use: Yes     Comment: occasional    OB History   Grav Para Term Preterm Abortions TAB SAB Ect Mult Living                  Review of Systems  All systems reviewed and negative, other than as noted in HPI.   Allergies  Sumycin  Home Medications  No current outpatient prescriptions on file.  BP 132/82  Pulse 95  Temp(Src) 100 F (37.8 C) (Oral)  Resp 16  SpO2 99%  Physical Exam  Nursing note and vitals reviewed. Constitutional: She appears well-developed and well-nourished. No distress.  HENT:  Head: Normocephalic and atraumatic.  Eyes: Conjunctivae are normal. Right eye exhibits no discharge. Left eye exhibits no discharge.   Neck: Neck supple.  Cardiovascular: Normal rate, regular rhythm and normal heart sounds.  Exam reveals no gallop and no friction rub.   No murmur heard. Pulmonary/Chest: Effort normal and breath sounds normal. No respiratory distress.  Abdominal: Soft. She exhibits no distension. There is no tenderness.  Musculoskeletal:  RLE shortened and externally rotated. Diffuse tenderness of hip. Cannot range 2/2 pain. Closed injury. NVI distally.   Neurological: She is alert.  Skin: Skin is warm and dry.  Psychiatric: She has a normal mood and affect. Her behavior is normal. Thought content normal.    ED Course  Procedures (including critical care time)  Labs Reviewed  CBC - Abnormal; Notable for the following:    RDW 15.9 (*)    All other components within normal limits  BASIC METABOLIC PANEL - Abnormal; Notable for the following:    Glucose, Bld 137 (*)    All other components within normal limits  URINALYSIS, ROUTINE W REFLEX MICROSCOPIC - Abnormal; Notable for the following:    APPearance CLOUDY (*)    All other components within normal limits  CBC - Abnormal; Notable for the following:    Hemoglobin 11.7 (*)    HCT 34.8 (*)    RDW 16.3 (*)    All other components within normal limits  BASIC METABOLIC PANEL - Abnormal; Notable for the following:    Glucose, Bld 108 (*)  Creatinine, Ser 0.48 (*)    All other components within normal limits  CBC - Abnormal; Notable for the following:    Hemoglobin 11.6 (*)    HCT 34.7 (*)    RDW 16.3 (*)    All other components within normal limits  CREATININE, SERUM - Abnormal; Notable for the following:    Creatinine, Ser 0.49 (*)    All other components within normal limits  CBC - Abnormal; Notable for the following:    Hemoglobin 11.4 (*)    HCT 34.1 (*)    RDW 16.3 (*)    All other components within normal limits  MRSA PCR SCREENING  VITAMIN D 1,25 DIHYDROXY   Dg Hip Operative Right  11/20/2012  *RADIOLOGY REPORT*  Clinical Data:  Right femur fracture.  DG OPERATIVE RIGHT HIP  Comparison: Radiographs dated 11/19/2012  Findings: AP and lateral C-arm images demonstrate the patient has undergone open reduction and internal fixation of the intertrochanteric fracture of the proximal right femur.  Alignment and position of the fracture fragments is near anatomic. Compression screw and intramedullary rod are in place with fixation screw.  IMPRESSION: Open reduction and internal fixation of the right femoral fracture.   Original Report Authenticated By: Francene Boyers, M.D.      1. Intertrochanteric fracture, right, closed, initial encounter   2. Chronic use of steroids   3. Rheumatoid arthritis   4. Unspecified asthma   5. GERD (gastroesophageal reflux disease)   6. Hip fracture, right, closed, initial encounter   7. Hip fracture, right, closed, with routine healing, subsequent encounter       MDM  56yf with R hip fx. Closed. NVI intact. Discussed with ortho and hospitalist for admission.         Raeford Razor, MD 11/22/12 [phone]

## 2012-11-22 NOTE — Progress Notes (Signed)
[facility] PROGRESS NOTE  [name]:[id number] DOB: 1955-09-26 DOA: 11/19/2012 PCP: Ernst Breach, PA-C  Assessment/Plan: 1. RA: stable. Continue prednisone and follow up with rheumatologist at discharge. 2. Right hip fracture: per ortho service. S/P right hip trochanteric nail placement. Continue PRN analgesics and PT rehab. Will need SNF. Anticoagulation per ortho. 3. Asthma: seasonal. Well control. No SOB and no wheezing currently. 4. GERD: continue PPI. 5. Chronic use of steroids: will need biphosphonate prescription and Dexa scan at discharge. Vit D level pending. -will continue oscal  DVT: lovenox while inpatient and per Ortho rec's; plan is for full dose ASA at discharge.  Code Status: full Family Communication: no family at bedside Disposition Plan: Per PT rec's will need SNF   Consultants:  Dr. Rennis Chris (orthopedic service)  Procedures:  plan is for right hip surgery (ORIF) later today (4/14)  Antibiotics:  none  HPI/Subjective: Afebrile. Still with significant pain on her right hip; difficulty ambulating and struggling with balance. Pt recommending SNF.  Objective: Filed Vitals:   11/21/12 1200 11/21/12 1356 11/21/12 2221 11/22/12 0618  BP:  134/74 105/59 120/71  Pulse:  97 91 85  Temp:  99.2 F (37.3 C) 100 F (37.8 C) 99.8 F (37.7 C)  TempSrc:      Resp: 18 18 18 18   SpO2:  98% 97% 98%    Intake/Output Summary (Last 24 hours) at 11/22/12 1244 Last data filed at 11/22/12 0900  Gross per 24 hour  Intake   1440 ml  Output    500 ml  Net    940 ml   There were no vitals filed for this visit.  Exam:   General:  Still complaining of pain on her right hip and leg, afebrile, no CP or SOB  Cardiovascular: S1 and S2, no rubs or gallops  Respiratory: CTA bilaterally  Abdomen: soft, NT, ND, positive BS  Musculoskeletal: no edema, no cyanosis or clubbing; some joint deformities on her hands from RA; but not joint swelling at  this moment. Decrease range of motion and pain on her right hip continues.  Data Reviewed: Basic Metabolic Panel:  Recent Labs Lab 11/19/12 1900 11/20/12 2156 11/21/12 0558  NA 135  --  139  K 4.9  --  3.9  CL 99  --  103  CO2 24  --  29  GLUCOSE 137*  --  108*  BUN 20  --  9  CREATININE 0.52 0.49* 0.48*  CALCIUM 10.3  --  9.4   CBC:  Recent Labs Lab 11/19/12 1900 11/20/12 2156 11/21/12 0558 11/22/12 0600  WBC 6.5 9.9 8.8 10.5  HGB 13.4 11.6* 11.7* 11.4*  HCT 39.1 34.7* 34.8* 34.1*  MCV 82.5 84.8 85.1 84.8  PLT 232 189 179 196   BNP (last 3 results)  Recent Labs  08/05/12 1340 08/07/12 1357  PROBNP 804.8* 111.0*     Recent Results (from the past 240 hour(s))  MRSA PCR SCREENING     Status: None   Collection Time    11/20/12 12:37 AM      Result Value Range Status   MRSA by PCR NEGATIVE  NEGATIVE Final   Comment:            The GeneXpert MRSA Assay (FDA     approved for NASAL specimens     only), is one component of a     comprehensive MRSA colonization     surveillance program. It is not     intended  to diagnose MRSA     infection nor to guide or     monitor treatment for     MRSA infections.     Studies: Dg Hip Operative Right  12/19/2012  *RADIOLOGY REPORT*  Clinical Data: Right femur fracture.  DG OPERATIVE RIGHT HIP  Comparison: Radiographs dated 11/19/2012  Findings: AP and lateral C-arm images demonstrate the patient has undergone open reduction and internal fixation of the intertrochanteric fracture of the proximal right femur.  Alignment and position of the fracture fragments is near anatomic. Compression screw and intramedullary rod are in place with fixation screw.  IMPRESSION: Open reduction and internal fixation of the right femoral fracture.   Original Report Authenticated By: Francene Boyers, M.D.     Scheduled Meds: . calcium-vitamin D  2 tablet Oral BID  . docusate sodium  100 mg Oral BID  . enoxaparin (LOVENOX) injection  40 mg  Subcutaneous Q24H  . folic acid  1 mg Oral Daily  . pantoprazole  40 mg Oral QAC breakfast  . polyethylene glycol  17 g Oral BID  . potassium chloride SA  20 mEq Oral Daily  . predniSONE  10 mg Oral Daily   Continuous Infusions:    Principal Problem:   Hip fracture, right Active Problems:   Rheumatoid arthritis   Chronic use of steroids    Time spent: < 30 minutes    Kendal Raffo  [facility] Pager [phone]. If 7PM-7AM, please contact night-coverage at [email or web address], password [facility] 11/22/2012, 12:44 PM  LOS: 3 days

## 2012-11-23 MED ORDER — OXYCODONE HCL ER 10 MG PO T12A
10.0000 mg | EXTENDED_RELEASE_TABLET | Freq: Two times a day (BID) | ORAL | Status: DC
  Administered 2012-11-23 – 2012-11-27 (×8): 10 mg via ORAL
  Filled 2012-11-23 (×8): qty 1

## 2012-11-23 MED ORDER — HYDROMORPHONE HCL 2 MG PO TABS: 2.0000 mg | ORAL_TABLET | ORAL | Status: DC | PRN

## 2012-11-23 MED ORDER — SENNA 8.6 MG PO TABS
1.0000 | ORAL_TABLET | Freq: Every day | ORAL | Status: DC
  Administered 2012-11-23 – 2012-11-25 (×3): 8.6 mg via ORAL
  Filled 2012-11-23 (×5): qty 1

## 2012-11-23 MED ORDER — OXYCODONE HCL 5 MG PO TABS
10.0000 mg | ORAL_TABLET | ORAL | Status: DC | PRN
  Administered 2012-11-23: 10 mg via ORAL
  Filled 2012-11-23 (×2): qty 2

## 2012-11-23 MED ORDER — HYDROMORPHONE HCL 2 MG PO TABS
2.0000 mg | ORAL_TABLET | ORAL | Status: DC | PRN
  Administered 2012-11-23: 4 mg via ORAL
  Administered 2012-11-23: 2 mg via ORAL
  Administered 2012-11-24 – 2012-11-27 (×16): 4 mg via ORAL
  Filled 2012-11-23 (×7): qty 2
  Filled 2012-11-23: qty 1
  Filled 2012-11-23 (×4): qty 2
  Filled 2012-11-23: qty 1
  Filled 2012-11-23: qty 2
  Filled 2012-11-23: qty 1
  Filled 2012-11-23 (×4): qty 2

## 2012-11-23 NOTE — Progress Notes (Signed)
[name]  MRN: [id number] DOB/Age: May 31, 1956 57 y.o. Physician: Jacquelyne Balint Procedure: Procedure(s) (LRB): right hip trochanteric nail (Right)     Subjective: C/o pain in right hip. States having rough day. Her therapy progress has been quite slow  Vital Signs Temp:  [97.6 F (36.4 C)-99.2 F (37.3 C)] 97.6 F (36.4 C) (04/17 0527) Pulse Rate:  [86-92] 86 (04/17 0527) Resp:  [18] 18 (04/17 0527) BP: (115-118)/(63-69) 118/69 mmHg (04/17 0527) SpO2:  [98 %] 98 % (04/17 0527)  Lab Results  Recent Labs  11/21/12 0558 11/22/12 0600  WBC 8.8 10.5  HGB 11.7* 11.4*  HCT 34.8* 34.1*  PLT 179 196   BMET  Recent Labs  11/20/12 2156 11/21/12 0558  NA  --  139  K  --  3.9  CL  --  103  CO2  --  29  GLUCOSE  --  108*  BUN  --  9  CREATININE 0.49* 0.48*  CALCIUM  --  9.4   INR  Date Value Range Status  08/05/2012 1.19  0.00 - 1.49 Final     Exam Right hip dressing clean. Moves feet well, guarded with hip motion. Thigh soft        Plan S/P ORIF R hip  Agree with ST NHP Continue PT/OT Will try Dilaudid po to see if offers better pain relief  Irelynn Schermerhorn for Dr.Kevin Supple 11/23/2012, 2:02 PM

## 2012-11-23 NOTE — Progress Notes (Signed)
Physical Therapy Treatment Patient Details Name: [name] MRN: [id number] DOB: February 13, 1956 Today's Date: 11/23/2012 Time: 1478-2956 PT Time Calculation (min): 32 min  PT Assessment / Plan / Recommendation Comments on Treatment Session  Pt. s/p R hip ORIF.  Very anxious today, worrying about her living arangements (lives in 2 level apartment and was having difficulty ascending/descending steps even prior to this admission).  She was agreeable to therapeutic exercises but did not agree to transfers and gait.  she needs max encouragement and reassurance.  Ralene Bathe PA came in during session.  Pt. is requesting note from MD for her landlord to swithch her to a one level apartment.  French Ana agreed to provide this note at pt's first follow up visit in the office.  This eased pt's mind and she was less anxious.  she will need extra time to progress in her rehab.    Follow Up Recommendations  SNF     Does the patient have the potential to tolerate intense rehabilitation     Barriers to Discharge        Equipment Recommendations  Rolling walker with 5" wheels;Wheelchair (measurements PT)    Recommendations for Other Services    Frequency Min 5X/week   Plan Discharge plan remains appropriate;Frequency remains appropriate    Precautions / Restrictions Precautions Precautions: Fall Restrictions Weight Bearing Restrictions: Yes RLE Weight Bearing: Weight bearing as tolerated   Pertinent Vitals/Pain See vitals tab    Mobility  Bed Mobility Bed Mobility: Not assessed Transfers Transfers: Not assessed Ambulation/Gait Ambulation/Gait Assistance: Not tested (comment)    Exercises General Exercises - Lower Extremity Ankle Circles/Pumps: AROM;15 reps;Both Quad Sets: AROM;10 reps;Right Short Arc Quad: 15 reps;AROM;Right;Supine Heel Slides: AROM;Right;10 reps;Supine   PT Diagnosis:    PT Problem List:   PT Treatment Interventions:     PT Goals Acute Rehab PT Goals Pt will  Perform Home Exercise Program: Independently PT Goal: Perform Home Exercise Program - Progress: Progressing toward goal  Visit Information  Last PT Received On: 11/23/12 Assistance Needed: +1    Subjective Data  Subjective: "This has been a really bad day" regarding pain   Cognition  Cognition Arousal/Alertness: Awake/alert Behavior During Therapy: Anxious Overall Cognitive Status: Within Functional Limits for tasks assessed General Comments: pt. appears to function at a lower educational level    Balance     End of Session PT - End of Session Activity Tolerance: Patient limited by pain;Other (comment) (limited by anxiousness) Patient left: in bed;with call bell/phone within reach Nurse Communication: Mobility status;Patient requests pain meds   GP     Ferman Hamming 11/23/2012, 2:16 PM Weldon Picking PT Acute Rehab Services [phone] [place] [phone]

## 2012-11-23 NOTE — Progress Notes (Signed)
[facility] PROGRESS NOTE  [name] WUJ:[id number] DOB: 1956/08/01 DOA: 11/19/2012 PCP: Ernst Breach, PA-C  Assessment/Plan: 1. RA: stable. Continue prednisone 10mg  and follow up with rheumatologist Dr. Azzie Roup at discharge. 2. Right hip fracture: per ortho service. S/P right hip trochanteric nail placement 4/15. Continue Oxycodone 10 mg q 4 prn-Added Oxycontin 10 Q12 attempt to wean IV pain meds and PT rehab. Will need SNF. Anticoagulation per ortho. 3. Asthma: seasonal. Well control. No SOB and no wheezing currently. 4. GERD: continue PPI. 5. Chronic use of steroids: will need biphosphonate prescription and Dexa scan at discharge. Vit D level pending-will continue oscal  DVT: lovenox while inpatient and per Ortho rec's; plan is for full dose ASA at discharge.  Code Status: full Family Communication: no family at bedside Disposition Plan: Per PT rec's will need SNF   Consultants:  Dr. Rennis Chris (orthopedic service)  Procedures:  ORIF 4/14  Antibiotics:  none  HPI/Subjective: Afebrile. Still with significant pain on her right hip states 10/10 pain despite pain meds-Dilaudid seems to help  Objective: Filed Vitals:   11/22/12 1357 11/22/12 2036 11/23/12 0527 11/23/12 1448  BP: 132/82 115/63 118/69 148/84  Pulse: 95 92 86 91  Temp: 100 F (37.8 C) 99.2 F (37.3 C) 97.6 F (36.4 C) 98.3 F (36.8 C)  TempSrc:  Oral Oral   Resp: 16 18 18 18   SpO2: 99% 98% 98% 98%    Intake/Output Summary (Last 24 hours) at 11/23/12 1709 Last data filed at 11/23/12 1142  Gross per 24 hour  Intake    480 ml  Output    400 ml  Net     80 ml   There were no vitals filed for this visit.  Exam:   General:  Still complaining of pain on her right hip and leg, afebrile, no CP or SOB  Cardiovascular: S1 and S2, no rubs or gallops  Respiratory: CTA bilaterally  Abdomen: soft, NT, ND, positive BS  Data Reviewed: Basic Metabolic Panel:  Recent Labs Lab  11/19/12 1900 11/20/12 2156 11/21/12 0558  NA 135  --  139  K 4.9  --  3.9  CL 99  --  103  CO2 24  --  29  GLUCOSE 137*  --  108*  BUN 20  --  9  CREATININE 0.52 0.49* 0.48*  CALCIUM 10.3  --  9.4   CBC:  Recent Labs Lab 11/19/12 1900 11/20/12 2156 11/21/12 0558 11/22/12 0600  WBC 6.5 9.9 8.8 10.5  HGB 13.4 11.6* 11.7* 11.4*  HCT 39.1 34.7* 34.8* 34.1*  MCV 82.5 84.8 85.1 84.8  PLT 232 189 179 196   BNP (last 3 results)  Recent Labs  08/05/12 1340 08/07/12 1357  PROBNP 804.8* 111.0*     Recent Results (from the past 240 hour(s))  MRSA PCR SCREENING     Status: None   Collection Time    11/20/12 12:37 AM      Result Value Range Status   MRSA by PCR NEGATIVE  NEGATIVE Final   Comment:            The GeneXpert MRSA Assay (FDA     approved for NASAL specimens     only), is one component of a     comprehensive MRSA colonization     surveillance program. It is not     intended to diagnose MRSA     infection nor to guide or     monitor treatment for  MRSA infections.     Studies: No results found.  Scheduled Meds: . calcium-vitamin D  2 tablet Oral BID  . docusate sodium  100 mg Oral BID  . enoxaparin (LOVENOX) injection  40 mg Subcutaneous Q24H  . folic acid  1 mg Oral Daily  . pantoprazole  40 mg Oral QAC breakfast  . polyethylene glycol  17 g Oral BID  . potassium chloride SA  20 mEq Oral Daily  . predniSONE  10 mg Oral Daily   Continuous Infusions:    Principal Problem:   Hip fracture, right Active Problems:   Rheumatoid arthritis   Chronic use of steroids    Time spent: < 30 minutes    Rhetta Mura  [facility] Pager [phone]. If 7PM-7AM, please contact night-coverage at [email or web address], password [facility] 11/23/2012, 5:09 PM  LOS: 4 days

## 2012-11-23 NOTE — Clinical Social Work Psychosocial (Signed)
Clinical Social Work Department  BRIEF PSYCHOSOCIAL ASSESSMENT  Patient: [name] Account Number: [id number]  Admit date: 11/19/12 Clinical Social Worker Jermone Geister Riley Kill, MSW Date/Time:  Referred by: Physician Date Referred:  Referred for   SNF Placement   Other Referral:  Interview type: Patient  Other interview type: PSYCHOSOCIAL DATA  Living Status: Patient lives with her 59 year old daughter Admitted from facility:  Level of care:  Primary support name:  Primary support relationship to patient:  Degree of support available:  Poor  CURRENT CONCERNS  Current Concerns   Post-Acute Placement   Other Concerns:  SOCIAL WORK ASSESSMENT / PLAN  CSW met with pt re: PT recommendation for SNF.   Pt lives with her 20 year old daughter and has no external help  CSW explained placement process and answered questions.   Pt reports no preference at this time    CSW completed FL2 and initiated SNF search.     Assessment/plan status: Information/Referral to [facility]  Other assessment/ plan:  Information/referral to community resources:  SNF   PTAR  PATIENT'S/FAMILY'S RESPONSE TO PLAN OF CARE:  Pt  reports she is agreeable to ST SNF in order to increase strength and independence with mobility prior to returning home  Pt verbalized understanding of placement process and appreciation for CSW assist.   Sabino Niemann, MSW [phone]

## 2012-11-24 LAB — VITAMIN D 1,25 DIHYDROXY: Vitamin D2 1, 25 (OH)2: <8 pg/mL

## 2012-11-24 MED ORDER — OXYCODONE HCL ER 10 MG PO T12A: 10.0000 mg | EXTENDED_RELEASE_TABLET | Freq: Two times a day (BID) | ORAL | Status: DC

## 2012-11-24 MED ORDER — OXYCODONE HCL 10 MG PO TABS: 10.0000 mg | ORAL_TABLET | ORAL | Status: AC | PRN

## 2012-11-24 MED ORDER — CALCIUM CARBONATE-VITAMIN D 500-200 MG-UNIT PO TABS: 2.0000 | ORAL_TABLET | Freq: Two times a day (BID) | ORAL | Status: AC

## 2012-11-24 MED ORDER — BISACODYL 10 MG RE SUPP: 10.0000 mg | Freq: Every day | RECTAL | Status: AC | PRN

## 2012-11-24 MED ORDER — METHOCARBAMOL 500 MG PO TABS: 500.0000 mg | ORAL_TABLET | Freq: Four times a day (QID) | ORAL | Status: AC | PRN

## 2012-11-24 NOTE — Progress Notes (Signed)
    Subjective: 4 Days Post-Op Procedure(s) (LRB): right hip trochanteric nail (Right) Patient reports pain as 3 on 0-10 scale.   Denies CP or SOB.  Voiding without difficulty. Positive flatus. Objective: Vital signs in last 24 hours: Temp:  [98.1 F (36.7 C)-98.3 F (36.8 C)] 98.2 F (36.8 C) (04/18 0604) Pulse Rate:  [79-94] 79 (04/18 0604) Resp:  [16-18] 16 (04/18 0604) BP: (120-148)/(73-84) 120/73 mmHg (04/18 0604) SpO2:  [96 %-100 %] 96 % (04/18 0604)  Intake/Output from previous day: 04/17 0701 - 04/18 0700 In: 600 [P.O.:600] Out: -  Intake/Output this shift:    Labs:  Recent Labs  11/22/12 0600  HGB 11.4*    Recent Labs  11/22/12 0600  WBC 10.5  RBC 4.02  HCT 34.1*  PLT 196   No results found for this basename: NA, K, CL, CO2, BUN, CREATININE, GLUCOSE, CALCIUM,  in the last 72 hours No results found for this basename: LABPT, INR,  in the last 72 hours  Physical Exam: Neurologically intact Neurovascular intact Intact pulses distally Incision: dressing C/D/I and no drainage Compartment soft  Assessment/Plan: 4 Days Post-Op Procedure(s) (LRB): right hip trochanteric nail (Right) Up with therapy Discharge to SNF  [facility] D for Dr. Venita Lick [facility] [phone] 11/24/2012, 9:49 AM

## 2012-11-24 NOTE — Care Management Note (Signed)
CARE MANAGEMENT NOTE 11/24/2012  Patient:  [name], [name]   Account Number:  [id number]  Date Initiated:  11/21/2012  Documentation initiated by:  Vance Peper  Subjective/Objective Assessment:   57 yr old female s/p left hip IM nailing.     Action/Plan:   CM spoke with patient concerning home health and DME needs at discharge. Patient states she isnt sure if she is going home. Has a 49yrold daughter @ home. SW consulted re: possible SNF.  CM will follow.   Anticipated DC Date:  11/24/2012   Anticipated DC Plan:  HOME W HOME HEALTH SERVICES      DC Planning Services  CM consult      [facility] Choice  HOME HEALTH  DURABLE MEDICAL EQUIPMENT   Choice offered to / List presented to:  C-1 Patient   DME arranged  Levan Hurst      DME agency  [facility]     HH arranged  HH-2 PT  HH-4 NURSE'S AIDE      HH agency  [facility]   Status of service:  Completed, signed off Medicare Important Message given?   (If response is "NO", the following Medicare IM given date fields will be blank) Date Medicare IM given:   Date Additional Medicare IM given:    Discharge Disposition:  HOME W HOME HEALTH SERVICES  Per UR Regulation:    If discussed at Long Length of Stay Meetings, dates discussed:    Comments:  11/24/12 14:15 Vance Peper, RN BSN Case Manager Patient can not affort the $75.00 per day co-pay for SNF. Patient has minimal finances. Physical therapy has worked with patient  and states she is not safe at present to go home. She has to go up flight of stairs for bathroom and bedroom. Therapist will see patient again today and will have weekend therapist to work with her, patient should be much better at that time. CM and Therapist contacted Dr. Mahala Menghini with this information.

## 2012-11-24 NOTE — Progress Notes (Signed)
Physical Therapy Treatment Patient Details Name: [name] MRN: [id number] DOB: Mar 02, 1956 Today's Date: 11/24/2012 Time: 6644-0347 PT Time Calculation (min): 47 min  PT Assessment / Plan / Recommendation Comments on Treatment Session  Pt. having great difficulty managing 3 steps.  Long discussion with pt. regarding her need for assist at home post DC.  She believes she can enlist the help of her daughter's father who is currently unemployed.  She will need 3n1 in addition to RW and HHPT.  Needs more practice with steps before safe for DC home    Follow Up Recommendations  Home health PT;Supervision/Assistance - 24 hour;Other (comment)     Does the patient have the potential to tolerate intense rehabilitation     Barriers to Discharge        Equipment Recommendations  Rolling walker with 5" wheels;Wheelchair (measurements PT);Wheelchair cushion (measurements PT);Other (comment) (3n1)    Recommendations for Other Services OT consult  Frequency Min 5X/week   Plan Discharge plan remains appropriate;Frequency remains appropriate    Precautions / Restrictions Precautions Precautions: Fall Restrictions Weight Bearing Restrictions: Yes RLE Weight Bearing: Weight bearing as tolerated   Pertinent Vitals/Pain Pain right hip (see vitals tab) with increasing activity    Mobility  Bed Mobility Bed Mobility: Sit to Supine Supine to Sit: 4: Min guard;With rails;HOB flat Sitting - Scoot to Edge of Bed: 6: Modified independent (Device/Increase time) Sit to Supine: 3: Mod assist Details for Bed Mobility Assistance: mod assist for R LE >L LE Transfers Transfers: Sit to Stand;Stand to Sit Sit to Stand: 4: Min guard;With upper extremity assist;With armrests;From chair/3-in-1;4: Min assist Stand to Sit: 4: Min assist;To chair/3-in-1;To bed;With upper extremity assist;With armrests Details for Transfer Assistance: pt. progressively needing more assist as she becam  fatigued Ambulation/Gait Ambulation/Gait Assistance: 4: Min assist Ambulation Distance (Feet): 100 Feet Assistive device: Rolling walker Ambulation/Gait Assistance Details: Pt. initially moving well, but with increasing pain in right hip as she continued on.  She needed several  standing rest breaks befor returning to room. Gait Pattern: Step-through pattern;Decreased step length - right;Decreased step length - left;Decreased stance time - right Gait velocity: decreased Stairs: Yes Stairs Assistance: 3: Mod assist Stairs Assistance Details (indicate cue type and reason): needecd to stand to rest repeatedly; her railing is on left side to ascend her steps to bedrrom, and this to her disadvantage Stair Management Technique: One rail Left;Sideways Number of Stairs: 3    Exercises     PT Diagnosis:    PT Problem List:   PT Treatment Interventions:     PT Goals Acute Rehab PT Goals Pt will go Supine/Side to Sit: with modified independence PT Goal: Supine/Side to Sit - Progress: Progressing toward goal Pt will Ambulate: 16 - 50 feet;with modified independence PT Goal: Ambulate - Progress: Progressing toward goal Pt will Go Up / Down Stairs: Flight;with modified independence PT Goal: Up/Down Stairs - Progress: Progressing toward goal  Visit Information  Last PT Received On: 11/24/12 Assistance Needed: +1    Subjective Data  Subjective: Pt. with reports of increasing pain in right hip with increasing activity   Cognition  Cognition Arousal/Alertness: Awake/alert Behavior During Therapy: WFL for tasks assessed/performed Overall Cognitive Status: Within Functional Limits for tasks assessed General Comments: pt. appears to function at a lower educational level    Balance     End of Session PT - End of Session Equipment Utilized During Treatment: Gait belt Activity Tolerance: Patient limited by pain;Patient limited by fatigue Patient left:  in bed;with call bell/phone within  reach Nurse Communication: Mobility status;Patient requests pain meds   GP     Ferman Hamming 11/24/2012, 4:20 PM Weldon Picking PT Acute Rehab Services [phone] Beeper [phone]

## 2012-11-24 NOTE — Discharge Summary (Signed)
Physician Discharge Summary  [name] YNW:[id number] DOB: 1956/06/01 DOA: 11/19/2012  PCP: Ernst Breach, PA-C  Admit date: 11/19/2012 Discharge date: 11/24/2012  Time spent: 35 minutes  Recommendations for Outpatient Follow-up:  1. Patient needs to be on aspirin at least for 6 weeks subsequent to hip repair  2. Followup vitamin D. levels drawn in hospital 3. Patient may need increase in pain medication regimen 4. Repeat CBC in about a week 5. Patient is full weight bearing   Discharge Diagnoses:  Principal Problem:   Hip fracture, right Active Problems:   Rheumatoid arthritis   Chronic use of steroids   Discharge Condition: Good  Diet recommendation: Heart healthy  There were no vitals filed for this visit.  History of present illness:  57 year old female admitted 11/19/2012 with a right hip fracture subsequent to accidental fall while playing soccer.  Patient was found to have an intraventricular fracture and underwent repair as per  Hospital Course:  1. RA: stable. Continue prednisone 10mg  and follow up with rheumatologist Dr. Azzie Roup at discharge 2. Right hip fracture: per ortho service. S/P right hip trochanteric nail placement 4/15. Continue Oxycodone 10 mg q 4 prn-Added Oxycontin 10 Q12-she may need an increase in her long-acting pain medications. Would suggest using Tylenol as first choice practice medicating with opiates.  Will need SNF. Anticoagulation is ASA 325daily 3. Asthma: seasonal. Well control. No SOB and no wheezing currently. 4. GERD: continue PPI. 5. Chronic use of steroids: consider biphosphonate prescription and Dexa scan on follow-up. Vit D level pending-will continue oscal   Procedures:  R hip intertrochanteric nail placed 4.15  Consultations:  Orthopedics  Discharge Exam: Filed Vitals:   11/23/12 0527 11/23/12 1448 11/23/12 2127 11/24/12 0604  BP: 118/69 148/84 130/78 120/73  Pulse: 86 91 94 79  Temp: 97.6 F (36.4 C)  98.3 F (36.8 C) 98.1 F (36.7 C) 98.2 F (36.8 C)  TempSrc: Oral  Oral Oral  Resp: 18 18 18 16   SpO2: 98% 98% 100% 96%   Alert.  Ambulated from bathroom to bed withminimal diffculty  General: pleasant, oriented Cardiovascular:  s1 s2 no m/r/g Respiratory: clear  Discharge Instructions  Discharge Orders   Future Appointments Provider Department Dept Phone   12/08/2012 10:00 AM [facility] [phone]   12/08/2012 11:00 AM Nyoka Cowden, MD [facility] [phone]   Future Orders Complete By Expires     Diet - low sodium heart healthy  As directed     Increase activity slowly  As directed     Weight bearing as tolerated  As directed     Weight bearing as tolerated  As directed         Medication List    STOP taking these medications       traMADol 50 MG tablet  Commonly known as:  ULTRAM      TAKE these medications       aspirin EC 325 MG tablet  Take 1 tablet (325 mg total) by mouth daily.     bisacodyl 10 MG suppository  Commonly known as:  DULCOLAX  Place 1 suppository (10 mg total) rectally daily as needed.     calcium-vitamin D 500-200 MG-UNIT per tablet  Commonly known as:  OSCAL WITH D  Take 2 tablets by mouth 2 (two) times daily.     folic acid 1 MG tablet  Commonly known as:  FOLVITE  Take 1 mg by mouth daily.  HYDROmorphone 2 MG tablet  Commonly known as:  DILAUDID  Take 1-2 tablets (2-4 mg total) by mouth every 4 (four) hours as needed for pain.     methocarbamol 500 MG tablet  Commonly known as:  ROBAXIN  Take 1 tablet (500 mg total) by mouth every 6 (six) hours as needed.     Oxycodone HCl 10 MG Tabs  Take 1 tablet (10 mg total) by mouth every 4 (four) hours as needed.     OxyCODONE 10 mg T12a  Commonly known as:  OXYCONTIN  Take 1 tablet (10 mg total) by mouth every 12 (twelve) hours.     pantoprazole 40 MG tablet  Commonly known as:  PROTONIX  Take 1 tablet (40 mg total) by mouth daily.  Take 30-60 min before first meal of the day     potassium chloride SA 20 MEQ tablet  Commonly known as:  K-DUR,KLOR-CON  Take 1 tablet (20 mEq total) by mouth daily.     predniSONE 10 MG tablet  Commonly known as:  DELTASONE  Take 10 mg by mouth daily.     PRESCRIPTION MEDICATION  Receives weekly injection for arthritis at patient's doctor's office          The results of significant diagnostics from this hospitalization (including imaging, microbiology, ancillary and laboratory) are listed below for reference.    Significant Diagnostic Studies: Dg Chest 1 View  11/19/2012  *RADIOLOGY REPORT*  Clinical Data: Fall.  Hip pain.  CHEST - 1 VIEW  Comparison: PA and lateral chest 11/07/2012.  Findings: Again seen is cardiomegaly without edema.  No pneumothorax or pleural effusion. Remote healed rib fractures are noted.  IMPRESSION: No acute disease.   Original Report Authenticated By: Holley Dexter, M.D.    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  No focal consolidation. No pleural effusion or pneumothorax.  Cardiomediastinal silhouette is within normal limits.  Mild degenerative changes of the visualized thoracolumbar spine.  IMPRESSION: No evidence of acute cardiopulmonary disease.   Original Report Authenticated By: Charline Bills, M.D.    Dg Hip Complete Right  11/19/2012  *RADIOLOGY REPORT*  Clinical Data: Fall, right hip pain and bruising.  RIGHT HIP - COMPLETE 2+ VIEW  Comparison: None.  Findings: The patient has an acute right intertrochanteric fracture.  No other focal bony or joint abnormality is identified.  IMPRESSION: Acute right intertrochanteric fracture.   Original Report Authenticated By: Holley Dexter, M.D.    Dg Hip Operative Right  11/20/2012  *RADIOLOGY REPORT*  Clinical Data: Right femur  fracture.  DG OPERATIVE RIGHT HIP  Comparison: Radiographs dated 11/19/2012  Findings: AP and lateral C-arm images demonstrate the patient has undergone open reduction and internal fixation of the intertrochanteric fracture of the proximal right femur.  Alignment and position of the fracture fragments is near anatomic. Compression screw and intramedullary rod are in place with fixation screw.  IMPRESSION: Open reduction and internal fixation of the right femoral fracture.   Original Report Authenticated By: Francene Boyers, M.D.     Microbiology: Recent Results (from the past 240 hour(s))  MRSA PCR SCREENING     Status: None   Collection Time    11/20/12 12:37 AM      Result Value Range Status   MRSA by PCR NEGATIVE  NEGATIVE Final   Comment:  The GeneXpert MRSA Assay (FDA     approved for NASAL specimens     only), is one component of a     comprehensive MRSA colonization     surveillance program. It is not     intended to diagnose MRSA     infection nor to guide or     monitor treatment for     MRSA infections.     Labs: Basic Metabolic Panel:  Recent Labs Lab 11/19/12 1900 11/20/12 2156 11/21/12 0558  NA 135  --  139  K 4.9  --  3.9  CL 99  --  103  CO2 24  --  29  GLUCOSE 137*  --  108*  BUN 20  --  9  CREATININE 0.52 0.49* 0.48*  CALCIUM 10.3  --  9.4   Liver Function Tests: No results found for this basename: AST, ALT, ALKPHOS, BILITOT, PROT, ALBUMIN,  in the last 168 hours No results found for this basename: LIPASE, AMYLASE,  in the last 168 hours No results found for this basename: AMMONIA,  in the last 168 hours CBC:  Recent Labs Lab 11/19/12 1900 11/20/12 2156 11/21/12 0558 11/22/12 0600  WBC 6.5 9.9 8.8 10.5  HGB 13.4 11.6* 11.7* 11.4*  HCT 39.1 34.7* 34.8* 34.1*  MCV 82.5 84.8 85.1 84.8  PLT 232 189 179 196   Cardiac Enzymes: No results found for this basename: CKTOTAL, CKMB, CKMBINDEX, TROPONINI,  in the last 168 hours BNP: BNP (last 3  results)  Recent Labs  08/05/12 1340 08/07/12 1357  PROBNP 804.8* 111.0*   CBG: No results found for this basename: GLUCAP,  in the last 168 hours     Signed:  Rhetta Mura  [facility] 11/24/2012, 10:33 AM

## 2012-11-24 NOTE — Progress Notes (Signed)
Physical Therapy Treatment Patient Details Name: [name] MRN: [id number] DOB: 1956/06/09 Today's Date: 11/24/2012 Time: 1339-1410 PT Time Calculation (min): 31 min  PT Assessment / Plan / Recommendation Comments on Treatment Session  Pt. is now giong to Dc home due to her inability to afford $70 co-pay per day at SNF.  She tells me she has a 2 level apartment with NO bathroom on the first (main) level.  Bathroom and bedrooms are upstairs. From a safety standpoint, she is not yet able to maneuver to get up and down a flight of stairs yet.  Discussed this with Vance Peper, RN case manager as welll as Dr. Mahala Menghini.  The plan is to work toward getting her independent in negotiating a flight of steps safely as soon as possible.  Additionally, she will need max home care services including HHPT, aide, and sugges HHOT eval in the home.  Will attempt to see again this pm as time permits, then follow up tomorrow.    Follow Up Recommendations  Home health PT;Supervision/Assistance - 24 hour;Other (comment) (24 hours assist not available but recommended)     Does the patient have the potential to tolerate intense rehabilitation     Barriers to Discharge        Equipment Recommendations  Rolling walker with 5" wheels;Other (comment);Wheelchair (measurements PT);Wheelchair cushion (measurements PT) (3n1)    Recommendations for Other Services OT consult  Frequency Min 5X/week   Plan Discharge plan needs to be updated    Precautions / Restrictions Precautions Precautions: Fall Restrictions Weight Bearing Restrictions: Yes RLE Weight Bearing: Weight bearing as tolerated   Pertinent Vitals/Pain Denies pain, states pain much better controlled since pain medicine changed yesterday    Mobility  Bed Mobility Bed Mobility: Supine to Sit Supine to Sit: 4: Min guard;With rails;HOB flat Sitting - Scoot to Edge of Bed: 6: Modified independent (Device/Increase time) Sit to Supine: Not Tested  (comment) Details for Bed Mobility Assistance: managing with use of bedrails Transfers Transfers: Sit to Stand;Stand to Sit Sit to Stand: 4: Min guard;With upper extremity assist;From bed Stand to Sit: 4: Min guard;With upper extremity assist;To bed Details for Transfer Assistance: Pt. needs min guard assist for safety and to assure balance Ambulation/Gait Ambulation/Gait Assistance: 4: Min assist Ambulation Distance (Feet): 100 Feet Assistive device: Rolling walker Ambulation/Gait Assistance Details: Pt. needs cues for correct RW placement and gait sequence.  Min assist for safety/stability. Gait Pattern: Step-through pattern;Decreased step length - right;Decreased step length - left Stairs: No    Exercises     PT Diagnosis:    PT Problem List:   PT Treatment Interventions:     PT Goals Acute Rehab PT Goals Pt will go Supine/Side to Sit: with modified independence PT Goal: Supine/Side to Sit - Progress: Progressing toward goal Pt will Ambulate: 16 - 50 feet;with modified independence PT Goal: Ambulate - Progress: Progressing toward goal  Visit Information  Last PT Received On: 11/24/12 Assistance Needed: +1    Subjective Data  Subjective: Pt. says her family will be coming on Sunday.  she reports she has an apartment with no bathroom on main level and bedrooms and bath are up a flight of steps.  She says she cant afford the $70 per day co-pay to go to SNF, and is concerned about managing in her own home currently./   Cognition  Cognition Arousal/Alertness: Awake/alert Behavior During Therapy: WFL for tasks assessed/performed (with some anxiousness at times) Overall Cognitive Status: Within Functional Limits for tasks  assessed General Comments: pt. appears to function at a lower educational level    Balance     End of Session PT - End of Session Equipment Utilized During Treatment: Gait belt Activity Tolerance: Patient tolerated treatment well Patient left: in bed;Other  (comment);with call bell/phone within reach (seated at EOB by her request) Nurse Communication: Mobility status;Other (comment) (need to delay DC due to safety concerns,   inaccessible home)   GP     Ferman Hamming 11/24/2012, 2:31 PM Weldon Picking PT Acute Rehab Services [phone] Beeper [phone]

## 2012-11-25 ENCOUNTER — Encounter: Payer: Self-pay | Admitting: Family Medicine

## 2012-11-25 NOTE — Progress Notes (Signed)
OT Treatment   11/25/12 1034  OT Visit Information  Last OT Received On 11/25/12  OT Time Calculation  OT Start Time 1000  OT Stop Time 1018  OT Time Calculation (min) 18 min  Precautions  Precautions Fall  Restrictions  RLE Weight Bearing WBAT  ADL  Lower Body Dressing Simulated;Minimal assistance  Where Assessed - Lower Body Dressing Unsupported standing;Supported sit to stand  ADL Comments educated pt on use of AE for ADLs. Pt verbalized understanding but states she does not have the funds to purchase thie equipment. Provided "hip kit" from indigent supply. Pt also educated on adaptations for RA and verbalized understanding of each. Pt encouraged to better manage pain medication and activity level so as not to allow pain to increase to a level where she is unable to tolerate it/perform self care activities.  Cognition  Arousal/Alertness Awake/alert  Behavior During Therapy WFL for tasks assessed/performed  Overall Cognitive Status Within Functional Limits for tasks assessed  Transfers  Sit to Stand 4: Min guard;From chair/3-in-1  Stand to Sit 4: Min guard;To chair/3-in-1  OT - End of Session  Equipment Utilized During Treatment Gait belt  Activity Tolerance Patient limited by pain  Patient left in chair;with call bell/phone within reach  Nurse Communication Mobility status  OT Assessment/Plan  Comments on Treatment Session AE education performed  OT Plan Discharge plan remains appropriate  OT Frequency Min 2X/week  Follow Up Recommendations Home health OT;Supervision/Assistance - 24 hour  OT Equipment 3 in 1 bedside comode;Hospital bed  ADL Goals  Pt Will Perform Lower Body Bathing with supervision;with caregiver independent in assisting;Sit to stand from chair;Unsupported;with adaptive equipment  ADL Goal: Lower Body Bathing - Progress Progressing toward goals  Pt Will Perform Lower Body Dressing with supervision;with caregiver independent in assisting;Sit to stand from  chair;Unsupported;with adaptive equipment  ADL Goal: Lower Body Dressing - Progress Progressing toward goals  OT General Charges  $OT Visit 1 Procedure  OT Treatments  $Self Care/Home Management  8-22 mins

## 2012-11-25 NOTE — Progress Notes (Signed)
Subjective: 5 Days Post-Op Procedure(s) (LRB): right hip trochanteric nail (Right) Patient reports pain as mild.    Objective: Vital signs in last 24 hours: Temp:  [97.6 F (36.4 C)-99 F (37.2 C)] 99 F (37.2 C) (04/19 0640) Pulse Rate:  [74-88] 74 (04/19 0640) Resp:  [18] 18 (04/19 0640) BP: (101-112)/(78-86) 112/86 mmHg (04/19 0640) SpO2:  [98 %-99 %] 99 % (04/19 0640)  Intake/Output from previous day:   Intake/Output this shift:    No results found for this basename: HGB,  in the last 72 hours No results found for this basename: WBC, RBC, HCT, PLT,  in the last 72 hours No results found for this basename: NA, K, CL, CO2, BUN, CREATININE, GLUCOSE, CALCIUM,  in the last 72 hours No results found for this basename: LABPT, INR,  in the last 72 hours  Neurologically intact Neurovascular intact Incision: dressing C/D/I No cellulitis present Compartment soft  Assessment/Plan: 5 Days Post-Op Procedure(s) (LRB): right hip trochanteric nail (Right) Up with therapy Discharge home once all arrangements are finalized  Joanell Cressler V 11/25/2012, 10:31 AM

## 2012-11-25 NOTE — Progress Notes (Signed)
Physical Therapy Treatment Patient Details Name: [name] MRN: [id number] DOB: Nov 02, 1955 Today's Date: 11/25/2012 Time: 4098-1191 PT Time Calculation (min): 41 min  PT Assessment / Plan / Recommendation Comments on Treatment Session  Patient with increased pain today, greatly limiting mobility.  Discussed with patient the need to take pain meds as scheduled to keep pain better controlled.  Patient reports she does has assist at discharge 24 hours.  Patient would benefit from hospital bed initially until she can go up the flight of stairs.  Agree with need for HHPT and aide.    Follow Up Recommendations  Home health PT;Supervision/Assistance - 24 hour     Does the patient have the potential to tolerate intense rehabilitation     Barriers to Discharge        Equipment Recommendations  Rolling walker with 5" wheels;Wheelchair (measurements PT);Wheelchair cushion (measurements PT);Hospital bed (3-in-1 BSC)    Recommendations for Other Services    Frequency Min 5X/week   Plan Discharge plan remains appropriate;Frequency remains appropriate;Equipment recommendations need to be updated    Precautions / Restrictions Precautions Precautions: Fall Restrictions Weight Bearing Restrictions: Yes RLE Weight Bearing: Weight bearing as tolerated   Pertinent Vitals/Pain Pain 9/10 limiting mobility.    Mobility  Bed Mobility Bed Mobility: Supine to Sit;Sitting - Scoot to Edge of Bed Supine to Sit: 4: Min guard;With rails;HOB flat Sitting - Scoot to Edge of Bed: 6: Modified independent (Device/Increase time);With rail Details for Bed Mobility Assistance: No cues needed.  Assist for safety only. Transfers Transfers: Sit to Stand;Stand to Sit Sit to Stand: 4: Min guard;With upper extremity assist;From bed;With armrests;From chair/3-in-1 Stand to Sit: 4: Min guard;With upper extremity assist;With armrests;To chair/3-in-1 Details for Transfer Assistance: Verbal cues for hand placement and  safety.  Cues to keep RW with her until fully prepared to sit.  Cues for RLE placement with sitting. Ambulation/Gait Ambulation/Gait Assistance: 4: Min guard Ambulation Distance (Feet): 30 Feet Assistive device: Rolling walker Ambulation/Gait Assistance Details: Pain limiting mobility today.  Needs cues for gait sequence today. Gait Pattern: Step-to pattern;Decreased stance time - right;Decreased step length - left;Antalgic;Trunk flexed Gait velocity: decreased Stairs: Yes Stairs Assistance: 3: Mod assist Stairs Assistance Details (indicate cue type and reason): Verbal cues for safe/correct technique.  Patient leaning forward onto rail with upper body.  Only able to go up/down 2 steps. Stair Management Technique: One rail Right;Step to pattern;Sideways Number of Stairs: 2    Exercises General Exercises - Lower Extremity Ankle Circles/Pumps: AROM;Both;10 reps;Seated Long Arc Quad: AROM;Right;10 reps;Seated Hip ABduction/ADduction: AROM;Right;10 reps;Seated     PT Goals Acute Rehab PT Goals PT Goal: Supine/Side to Sit - Progress: Progressing toward goal PT Transfer Goal: Bed to Chair/Chair to Bed - Progress: Progressing toward goal PT Goal: Ambulate - Progress: Progressing toward goal PT Goal: Up/Down Stairs - Progress: Progressing toward goal PT Goal: Perform Home Exercise Program - Progress: Progressing toward goal  Visit Information  Last PT Received On: 11/25/12 Assistance Needed: +1    Subjective Data  Subjective: "I had a good day yesterday.  Now my pain is bad again today"   Cognition  Cognition Arousal/Alertness: Awake/alert Behavior During Therapy: WFL for tasks assessed/performed Overall Cognitive Status: Within Functional Limits for tasks assessed    Balance     End of Session PT - End of Session Equipment Utilized During Treatment: Gait belt Activity Tolerance: Patient limited by pain Patient left: in chair;with call bell/phone within reach Nurse  Communication: Patient requests pain meds;Mobility status  GP     Vena Austria 11/25/2012, 9:54 AM Durenda Hurt. Renaldo Fiddler, [facility] Acute Rehab Services Pager [phone]

## 2012-11-25 NOTE — Progress Notes (Signed)
[facility] PROGRESS NOTE  [name] JWJ:[id number] DOB: [birth year] DOA: 11/19/2012 PCP: Ernst Breach, PA-C  Assessment/Plan: 1. RA: stable. Continue prednisone 10mg  and follow up with rheumatologist Dr. Azzie Roup at discharge. 2. Right hip fracture: per ortho service. S/P right hip trochanteric nail placement 4/15. Continue Oxycodone 10 mg q 4 prn-Added Oxycontin 10 Q12 attempt to wean IV pain meds and PT rehab. Cannot go to SNF as has to pay out of pocket.  PT/OT to progress patient as far as possible in Hospital until other options become available.  I have given her a note for her apartment superintendent today 4.19 so she might procure a first level apartment  Anticoagulation per ortho. 3. Asthma: seasonal. Well control. No SOB and no wheezing currently. 4. GERD: continue PPI. 5. Chronic use of steroids: will need biphosphonate prescription and Dexa scan at discharge. Vit D level pending-will continue oscal  DVT: lovenox while inpatient and per Ortho rec's; plan is for full dose ASA at discharge.  Code Status: full Family Communication: no family at bedside Disposition Plan: unknown   Consultants:  Dr. Rennis Chris (orthopedic service)  Procedures:  ORIF 4/14  Antibiotics:  none  HPI/Subjective: Afebrile. Frustrated at situation about not being able to leave hospital  Objective: Filed Vitals:   11/25/12 0640 11/25/12 0800 11/25/12 1200 11/25/12 1300  BP: 112/86     Pulse: 74     Temp: 99 F (37.2 C)     TempSrc:      Resp: 18 16 18    Height:    5\' 8"  (1.727 m)  Weight:    74.39 kg (164 lb)  SpO2: 99%      No intake or output data in the 24 hours ending 11/25/12 1529 Filed Weights   11/25/12 1300  Weight: 74.39 kg (164 lb)    Exam:   General:  Still complaining of pain on her right hip and leg, afebrile, no CP or SOB  Cardiovascular: S1 and S2, no rubs or gallops  Respiratory: CTA bilaterally  Abdomen: soft, NT, ND, positive  BS  Data Reviewed: Basic Metabolic Panel:  Recent Labs Lab 11/19/12 1900 11/20/12 2156 11/21/12 0558  NA 135  --  139  K 4.9  --  3.9  CL 99  --  103  CO2 24  --  29  GLUCOSE 137*  --  108*  BUN 20  --  9  CREATININE 0.52 0.49* 0.48*  CALCIUM 10.3  --  9.4   CBC:  Recent Labs Lab 11/19/12 1900 11/20/12 2156 11/21/12 0558 11/22/12 0600  WBC 6.5 9.9 8.8 10.5  HGB 13.4 11.6* 11.7* 11.4*  HCT 39.1 34.7* 34.8* 34.1*  MCV 82.5 84.8 85.1 84.8  PLT 232 189 179 196   BNP (last 3 results)  Recent Labs  08/05/12 1340 08/07/12 1357  PROBNP 804.8* 111.0*     Recent Results (from the past 240 hour(s))  MRSA PCR SCREENING     Status: None   Collection Time    11/20/12 12:37 AM      Result Value Range Status   MRSA by PCR NEGATIVE  NEGATIVE Final   Comment:            The GeneXpert MRSA Assay (FDA     approved for NASAL specimens     only), is one component of a     comprehensive MRSA colonization     surveillance program. It is not     intended to diagnose MRSA  infection nor to guide or     monitor treatment for     MRSA infections.     Studies: No results found.  Scheduled Meds: . calcium-vitamin D  2 tablet Oral BID  . docusate sodium  100 mg Oral BID  . enoxaparin (LOVENOX) injection  40 mg Subcutaneous Q24H  . folic acid  1 mg Oral Daily  . OxyCODONE  10 mg Oral Q12H  . pantoprazole  40 mg Oral QAC breakfast  . polyethylene glycol  17 g Oral BID  . potassium chloride SA  20 mEq Oral Daily  . predniSONE  10 mg Oral Daily  . senna  1 tablet Oral QHS   Continuous Infusions:    Principal Problem:   Hip fracture, right Active Problems:   Rheumatoid arthritis   Chronic use of steroids    Time spent: < 30 minutes    Rhetta Mura  [facility] Pager [phone]. If 7PM-7AM, please contact night-coverage at [email or web address], password [facility] 11/25/2012, 3:29 PM  LOS: 6 days

## 2012-11-25 NOTE — Progress Notes (Signed)
Occupational Therapy Treatment Patient Details Name: [name] MRN: [id number] DOB: April 17, 1956 Today's Date: 11/25/2012 Time: 5366-4403 OT Time Calculation (min): 15 min  OT Assessment / Plan / Recommendation Comments on Treatment Session Pt requesting to hold on AE education for a "little bit" due to Rt hip pain    Follow Up Recommendations  Home health OT;Supervision/Assistance - 24 hour    Barriers to Discharge       Equipment Recommendations  3 in 1 bedside comode;Hospital bed    Recommendations for Other Services    Frequency Min 2X/week   Plan Discharge plan remains appropriate    Precautions / Restrictions Precautions Precautions: Fall Restrictions Weight Bearing Restrictions: Yes RLE Weight Bearing: Weight bearing as tolerated   Pertinent Vitals/Pain 6/10 pain- RN provided medication    ADL  Grooming: Performed;Wash/dry hands;Modified independent Where Assessed - Grooming: Unsupported standing Toilet Transfer: Min Pension scheme manager Method: Sit to Barista: Bedside commode (over top toilet) Transfers/Ambulation Related to ADLs: VC for hand placement and correct body placement prior to sitting/standing    OT Diagnosis:    OT Problem List:   OT Treatment Interventions:     OT Goals Acute Rehab OT Goals OT Goal Formulation: With patient Time For Goal Achievement: 12/05/12 ADL Goals Pt Will Perform Lower Body Bathing: with supervision;with caregiver independent in assisting;Sit to stand from chair;Unsupported;with adaptive equipment Pt Will Perform Lower Body Dressing: with supervision;with caregiver independent in assisting;Sit to stand from chair;Unsupported;with adaptive equipment Pt Will Transfer to Toilet: with supervision;with DME;3-in-1;Drop arm 3-in-1 ADL Goal: Toilet Transfer - Progress: Progressing toward goals Pt Will Perform Toileting - Clothing Manipulation: with modified independence;Sitting on 3-in-1 or toilet ADL  Goal: Toileting - Clothing Manipulation - Progress: Progressing toward goals Pt Will Perform Tub/Shower Transfer: with min assist;with caregiver independent in assisting;with DME;Shower seat without back  Visit Information  Last OT Received On: 11/25/12 Assistance Needed: +1    Subjective Data      Prior Functioning       Cognition  Cognition Arousal/Alertness: Awake/alert Behavior During Therapy: WFL for tasks assessed/performed Overall Cognitive Status: Within Functional Limits for tasks assessed    Mobility  Bed Mobility Bed Mobility: Supine to Sit;Sitting - Scoot to Edge of Bed Supine to Sit: 4: Min guard;With rails;HOB flat Sitting - Scoot to Edge of Bed: 6: Modified independent (Device/Increase time);With rail Details for Bed Mobility Assistance: No cues needed.  Assist for safety only. Transfers Sit to Stand: 4: Min guard;From toilet;From chair/3-in-1 Stand to Sit: 4: Min guard;To chair/3-in-1;To toilet Details for Transfer Assistance: Verbal cues for hand placement and safety.  Cues to keep RW with her until fully prepared to sit.  Cues for RLE placement with sitting.    Exercises  General Exercises - Lower Extremity Ankle Circles/Pumps: AROM;Both;10 reps;Seated Long Arc Quad: AROM;Right;10 reps;Seated Hip ABduction/ADduction: AROM;Right;10 reps;Seated   Balance     End of Session OT - End of Session Equipment Utilized During Treatment: Gait belt (RW and 3n1) Activity Tolerance: Patient limited by pain Patient left: in chair;with call bell/phone within reach Nurse Communication: Mobility status  GO     Yareth Macdonnell 11/25/2012, 10:17 AM

## 2012-11-25 NOTE — Progress Notes (Addendum)
Physical Therapy Treatment Patient Details Name: [name] MRN: [id number] DOB: [birth year] Today's Date: 11/25/2012 Time: 1410-1443 PT Time Calculation (min): 33 min  PT Assessment / Plan / Recommendation Comments on Treatment Session  Patient able to negotiate 4 stairs with 2 rails with min assist.  Less pain this pm.  Patient able to ambulate greater distance.  Will need hospital bed and 3-in-1 BSC to allow her to stay on first floor.  HHPT can work with her on getting up to second floor. - Contacted CM: Patient would need to pay out of pocket ($100/week) for hospital bed.  She cannot afford this.  Notified patient.  Will continue to work on stairs in am.    Follow Up Recommendations  Home health PT;Supervision/Assistance - 24 hour     Does the patient have the potential to tolerate intense rehabilitation     Barriers to Discharge        Equipment Recommendations  Rolling walker with 5" wheels;Wheelchair (measurements PT);Wheelchair cushion (measurements PT);Hospital bed (3-in-1 BSC)    Recommendations for Other Services    Frequency Min 5X/week   Plan Discharge plan remains appropriate;Frequency remains appropriate    Precautions / Restrictions Precautions Precautions: Fall Restrictions Weight Bearing Restrictions: Yes RLE Weight Bearing: Weight bearing as tolerated   Pertinent Vitals/Pain     Mobility  Bed Mobility Bed Mobility: Supine to Sit;Sitting - Scoot to Edge of Bed;Sit to Supine Supine to Sit: 5: Supervision;With rails;HOB flat Sitting - Scoot to Edge of Bed: 6: Modified independent (Device/Increase time);With rail Sit to Supine: 4: Min guard;HOB flat Details for Bed Mobility Assistance: No cues needed.  Assist for safety only. Transfers Transfers: Sit to Stand;Stand to Sit Sit to Stand: 4: Min guard;With upper extremity assist;From bed;With armrests;From chair/3-in-1 Stand to Sit: 4: Min guard;With upper extremity assist;With armrests;To chair/3-in-1;To  bed Details for Transfer Assistance: Verbal cues for safety Ambulation/Gait Ambulation/Gait Assistance: 5: Supervision Ambulation Distance (Feet): 124 Feet Assistive device: Rolling walker Ambulation/Gait Assistance Details: Cues for sequencing - moving RW and RLE at same time. Gait Pattern: Step-to pattern;Decreased stance time - right;Decreased step length - left;Antalgic;Trunk flexed Gait velocity: decreased Stairs: Yes Stairs Assistance: 4: Min assist Stairs Assistance Details (indicate cue type and reason): Patient reports she can go in back door - closer to car.  Verbal cues to using 2 rails. Stair Management Technique: Two rails;Step to pattern;Forwards Number of Stairs: 4      PT Goals Acute Rehab PT Goals PT Goal: Supine/Side to Sit - Progress: Progressing toward goal PT Transfer Goal: Bed to Chair/Chair to Bed - Progress: Progressing toward goal PT Goal: Ambulate - Progress: Progressing toward goal PT Goal: Up/Down Stairs - Progress: Progressing toward goal  Visit Information  Last PT Received On: 11/25/12 Assistance Needed: +1    Subjective Data  Subjective: "I'm feeling better this afternoon"   Cognition  Cognition Arousal/Alertness: Awake/alert Behavior During Therapy: WFL for tasks assessed/performed Overall Cognitive Status: Within Functional Limits for tasks assessed    Balance     End of Session PT - End of Session Equipment Utilized During Treatment: Gait belt Activity Tolerance: Patient limited by pain Patient left: in bed;with call bell/phone within reach Nurse Communication: Mobility status;Patient requests pain meds   GP     Vena Austria 11/25/2012, 2:54 PM Durenda Hurt. Renaldo Fiddler, [facility] Acute Rehab Services Pager [phone]

## 2012-11-26 NOTE — Progress Notes (Signed)
[facility] PROGRESS NOTE  JAYDEEN DARLEY WUJ:[id number] DOB: [birth year] DOA: 11/19/2012 PCP: Ernst Breach, PA-C  Assessment/Plan: 1. RA: stable. Continue prednisone 10mg  and follow up with rheumatologist Dr. Azzie Roup at discharge. 2. Right hip fracture: per ortho service. S/P right hip trochanteric nail placement 4/15. Continue Oxycodone 10 mg q 4 prn-Added Oxycontin 10 Q12 attempt to wean IV pain meds and PT rehab. Cannot go to SNF as has to pay out of pocket.  PT/OT to progress patient as far as possible in Hospital until other options become available.  I have given her a note for her apartment superintendent today 4.19 so she might procure a first level apartment .  Filled out Paperwork for work as well.  ASpirin once d/c from Hospital for 2/2 prevention VTE 3. Asthma: seasonal. Well control. No SOB and no wheezing currently. 4. GERD: continue PPI. 5. Chronic use of steroids: will need biphosphonate prescription and Dexa scan at discharge. Vit D level pending-will continue oscal  DVT: lovenox while inpatient and per Ortho rec's; plan is for full dose ASA at discharge.  Code Status: full Family Communication: no family at bedside Disposition Plan: unknown   Consultants:  Dr. Rennis Chris (orthopedic service)  Procedures:  ORIF 4/14  Antibiotics:  none  HPI/Subjective: Afebrile. Doing fair.  Passing stool.  Objective: Filed Vitals:   11/25/12 1533 11/25/12 1600 11/25/12 2111 11/26/12 0520  BP: 119/80  138/83 113/62  Pulse: 97  86 80  Temp: 98.8 F (37.1 C)  97.8 F (36.6 C) 98 F (36.7 C)  TempSrc: Oral     Resp: 18 16 18 18   Height:      Weight:      SpO2: 100%  100% 100%    Intake/Output Summary (Last 24 hours) at 11/26/12 1349 Last data filed at 11/26/12 0522  Gross per 24 hour  Intake    480 ml  Output      0 ml  Net    480 ml   Filed Weights   11/25/12 1300  Weight: 74.39 kg (164 lb)    Exam:   General:  Still complaining of pain  on her right hip and leg, afebrile, no CP or SOB  Cardiovascular: S1 and S2, no rubs or gallops  Respiratory: CTA bilaterally  Abdomen: soft, NT, ND, positive BS  Data Reviewed: Basic Metabolic Panel:  Recent Labs Lab 11/19/12 1900 11/20/12 2156 11/21/12 0558  NA 135  --  139  K 4.9  --  3.9  CL 99  --  103  CO2 24  --  29  GLUCOSE 137*  --  108*  BUN 20  --  9  CREATININE 0.52 0.49* 0.48*  CALCIUM 10.3  --  9.4   CBC:  Recent Labs Lab 11/19/12 1900 11/20/12 2156 11/21/12 0558 11/22/12 0600  WBC 6.5 9.9 8.8 10.5  HGB 13.4 11.6* 11.7* 11.4*  HCT 39.1 34.7* 34.8* 34.1*  MCV 82.5 84.8 85.1 84.8  PLT 232 189 179 196   BNP (last 3 results)  Recent Labs  08/05/12 1340 08/07/12 1357  PROBNP 804.8* 111.0*     Recent Results (from the past 240 hour(s))  MRSA PCR SCREENING     Status: None   Collection Time    11/20/12 12:37 AM      Result Value Range Status   MRSA by PCR NEGATIVE  NEGATIVE Final   Comment:            The GeneXpert MRSA Assay (  FDA     approved for NASAL specimens     only), is one component of a     comprehensive MRSA colonization     surveillance program. It is not     intended to diagnose MRSA     infection nor to guide or     monitor treatment for     MRSA infections.     Studies: No results found.  Scheduled Meds: . calcium-vitamin D  2 tablet Oral BID  . docusate sodium  100 mg Oral BID  . enoxaparin (LOVENOX) injection  40 mg Subcutaneous Q24H  . folic acid  1 mg Oral Daily  . OxyCODONE  10 mg Oral Q12H  . pantoprazole  40 mg Oral QAC breakfast  . polyethylene glycol  17 g Oral BID  . potassium chloride SA  20 mEq Oral Daily  . predniSONE  10 mg Oral Daily  . senna  1 tablet Oral QHS   Continuous Infusions:    Principal Problem:   Hip fracture, right Active Problems:   Rheumatoid arthritis   Chronic use of steroids    Time spent: < 30 minutes    Rhetta Mura  [facility] Pager [phone]. If  7PM-7AM, please contact night-coverage at [email or web address], password [facility] 11/26/2012, 1:49 PM  LOS: 7 days

## 2012-11-26 NOTE — Progress Notes (Signed)
Occupational Therapy Treatment Patient Details Name: [name] MRN: [id number] DOB: 05/20/1956 Today's Date: 11/26/2012 Time: 4098-1191 OT Time Calculation (min): 24 min  OT Assessment / Plan / Recommendation Comments on Treatment Session  Pt is progressing with indpendence with BADLs.  She reports she will have assist from her "ex" and dtr and they are getting something for her to sleep on downstairs today so she will be ready to discharge tomorrow.    Follow Up Recommendations  Home health OT;Supervision/Assistance - 24 hour    Barriers to Discharge       Equipment Recommendations  3 in 1 bedside comode;Hospital bed    Recommendations for Other Services    Frequency Min 2X/week   Plan Discharge plan remains appropriate    Precautions / Restrictions Precautions Precautions: Fall Restrictions Weight Bearing Restrictions: Yes RLE Weight Bearing: Weight bearing as tolerated   Pertinent Vitals/Pain     ADL  Grooming: Supervision/safety;Brushing hair;Wash/dry hands Where Assessed - Grooming: Supported standing Lower Body Bathing: Simulated;Supervision/safety Where Assessed - Lower Body Bathing: Supported sit to stand Lower Body Dressing: Supervision/safety Where Assessed - Lower Body Dressing: Supported sit to Pharmacist, hospital: Radiographer, therapeutic Method: Sit to Barista: Raised toilet seat with arms (or 3-in-1 over toilet) Toileting - Clothing Manipulation and Hygiene: Supervision/safety Where Assessed - Engineer, mining and Hygiene: Standing Equipment Used: Rolling walker Transfers/Ambulation Related to ADLs: Supervision ADL Comments: Pt able to access feet by bending forward to don/doff socks.  Pt. instructed how to safely maneuver items over counters for meal prep and suggested that use a walker bag/basket - gave her options for this    OT Diagnosis:    OT Problem List:   OT Treatment Interventions:      OT Goals ADL Goals Pt Will Perform Lower Body Bathing: with supervision;with caregiver independent in assisting;Sit to stand from chair;Unsupported;with adaptive equipment ADL Goal: Lower Body Bathing - Progress: Progressing toward goals Pt Will Perform Lower Body Dressing: with supervision;with caregiver independent in assisting;Sit to stand from chair;Unsupported;with adaptive equipment ADL Goal: Lower Body Dressing - Progress: Progressing toward goals Pt Will Transfer to Toilet: with supervision;with DME;3-in-1;Drop arm 3-in-1 ADL Goal: Toilet Transfer - Progress: Progressing toward goals Pt Will Perform Toileting - Clothing Manipulation: with modified independence;Sitting on 3-in-1 or toilet ADL Goal: Toileting - Clothing Manipulation - Progress: Progressing toward goals Pt Will Perform Tub/Shower Transfer: with min assist;with caregiver independent in assisting;with DME;Shower seat without back ADL Goal: Web designer - Progress: Discontinued (comment) (Tub/shower is upstairs)  Visit Information  Last OT Received On: 11/26/12 Assistance Needed: +1    Subjective Data      Prior Functioning       Cognition  Cognition Arousal/Alertness: Awake/alert Behavior During Therapy: WFL for tasks assessed/performed Overall Cognitive Status: Within Functional Limits for tasks assessed    Mobility  Bed Mobility Bed Mobility: Supine to Sit;Sitting - Scoot to Edge of Bed Supine to Sit: 6: Modified independent (Device/Increase time) Sitting - Scoot to Edge of Bed: 6: Modified independent (Device/Increase time) Transfers Transfers: Sit to Stand;Stand to Sit Sit to Stand: 5: Supervision;With upper extremity assist;From chair/3-in-1;From bed Stand to Sit: 6: Modified independent (Device/Increase time);With upper extremity assist;To chair/3-in-1 Details for Transfer Assistance: verbal cues for safety    Exercises      Balance     End of Session OT - End of Session Activity  Tolerance: Patient tolerated treatment well Patient left: in chair;with call bell/phone within reach  GO  Jeani Hawking M 11/26/2012, 9:56 AM

## 2012-11-26 NOTE — Progress Notes (Signed)
Physical Therapy Treatment Patient Details Name: [name] MRN: [id number] DOB: 1955-09-06 Today's Date: 11/26/2012 Time: 4098-1191 PT Time Calculation (min): 38 min  PT Assessment / Plan / Recommendation Comments on Treatment Session  Reinforced with patient the need to keep pain under control. Pain was 9/10 with mobility and patient had not had pain meds since 5am.  Patient unable to complete stairs today.  Then states that she is getting a wheelchair and her caregiver is going to get her up the 2 steps in front of apt using w/c.  Also states that her caregiver is getting  "something like a bed" for her to sleep on the first floor.  HHPT will continue to work with patient on stairs.    Follow Up Recommendations  Home health PT;Supervision/Assistance - 24 hour     Does the patient have the potential to tolerate intense rehabilitation     Barriers to Discharge        Equipment Recommendations   (3-in-1 BSC (Patient reports she has a RW))    Recommendations for Other Services    Frequency Min 5X/week   Plan Discharge plan remains appropriate;Frequency remains appropriate;Equipment recommendations need to be updated    Precautions / Restrictions Precautions Precautions: Fall Restrictions Weight Bearing Restrictions: Yes RLE Weight Bearing: Weight bearing as tolerated   Pertinent Vitals/Pain Pain 9/10 with mobility.  Reinforced pain control with patient.    Mobility  Bed Mobility Bed Mobility: Not assessed Supine to Sit: 6: Modified independent (Device/Increase time) Sitting - Scoot to Edge of Bed: 6: Modified independent (Device/Increase time) Transfers Transfers: Sit to Stand;Stand to Sit Sit to Stand: 6: Modified independent (Device/Increase time);With upper extremity assist;With armrests;From chair/3-in-1 Stand to Sit: 6: Modified independent (Device/Increase time);With upper extremity assist;With armrests;To chair/3-in-1 Details for Transfer Assistance: Patient able  to manage transfers from chair and from 3-in-1 over toilet. Ambulation/Gait Ambulation/Gait Assistance: 6: Modified independent (Device/Increase time) Ambulation Distance (Feet): 200 Feet Assistive device: Rolling walker Ambulation/Gait Assistance Details: Cues for sequencing.  Patient with increasing pain at end of ambulation.  Called RN for pain meds. Gait Pattern: Step-through pattern;Decreased stance time - right;Decreased step length - left;Antalgic Gait velocity: decreased Stairs: Yes Stairs Assistance: 3: Mod assist (Patient unable to step up onto step due to pain)      PT Goals Acute Rehab PT Goals PT Goal: Ambulate - Progress: Met PT Goal: Up/Down Stairs - Progress: Progressing toward goal  Visit Information  Last PT Received On: 11/26/12 Assistance Needed: +1    Subjective Data  Subjective: "Patient c/o increased pain with mobility.  Reports she has not had pain meds since 5am.  Reinforced to patient need to keep pain under control and ask for pain meds on time.   Cognition  Cognition Arousal/Alertness: Awake/alert Behavior During Therapy: WFL for tasks assessed/performed Overall Cognitive Status: Within Functional Limits for tasks assessed General Comments: Needs repeated instructions regarding pain control.    Balance     End of Session PT - End of Session Equipment Utilized During Treatment: Gait belt Activity Tolerance: Patient limited by pain Patient left: in chair;with call bell/phone within reach;with nursing in room Nurse Communication: Patient requests pain meds   GP     Vena Austria 11/26/2012, 10:33 AM Durenda Hurt. Renaldo Fiddler, [facility] Acute Rehab Services Pager [phone]

## 2012-11-27 ENCOUNTER — Telehealth: Payer: Self-pay | Admitting: Family Medicine

## 2012-11-27 LAB — CREATININE, SERUM
Creatinine, Ser: 0.53 mg/dL (ref 0.50–1.10)
GFR calc Af Amer: >90 mL/min (ref >90)
GFR calc non Af Amer: >90 mL/min (ref >90)

## 2012-11-27 MED ORDER — HYDROMORPHONE HCL 2 MG PO TABS: 2.0000 mg | ORAL_TABLET | ORAL | Status: AC | PRN

## 2012-11-27 MED ORDER — OXYCODONE HCL ER 10 MG PO T12A: 10.0000 mg | EXTENDED_RELEASE_TABLET | Freq: Two times a day (BID) | ORAL | Status: AC

## 2012-11-27 NOTE — Progress Notes (Signed)
Occupational Therapy Treatment Patient Details Name: [name] MRN: [id number] DOB: Jun 20, 1956 Today's Date: 11/27/2012 Time: 7829-5621 OT Time Calculation (min): 24 min  OT Assessment / Plan / Recommendation Comments on Treatment Session  Pt practiced getting dressed today and became tearful with frustration. Pt at Min A level for LB ADLs. Educated on using a reacher to help don pants. Vc's required for pt to stand upright when going sit to stand as she tended to lean forwards. OT reinforced importance of not standing without walker.     Follow Up Recommendations  Home health OT;Supervision/Assistance - 24 hour    Barriers to Discharge       Equipment Recommendations  3 in 1 bedside comode;Hospital bed    Recommendations for Other Services    Frequency Min 2X/week   Plan      Precautions / Restrictions Precautions Precautions: Fall Restrictions Weight Bearing Restrictions: Yes RLE Weight Bearing: Weight bearing as tolerated   Pertinent Vitals/Pain Pt reported pain above a 5, but did not give specific number. Pt given pain meds during session.     ADL  Grooming: Supervision/safety;Teeth care;Wash/dry hands Where Assessed - Grooming: Supported standing Upper Body Bathing: Set up Where Assessed - Upper Body Bathing: Unsupported sitting Lower Body Bathing: Min guard Where Assessed - Lower Body Bathing: Supported sit to stand Upper Body Dressing: Set up Where Assessed - Upper Body Dressing: Unsupported sitting Lower Body Dressing: Minimal assistance Where Assessed - Lower Body Dressing: Supported sit to Pharmacist, hospital: Min Pension scheme manager Method: Sit to Barista: Raised toilet seat with arms (or 3-in-1 over toilet) Toileting - Clothing Manipulation and Hygiene: Supervision/safety Where Assessed - Toileting Clothing Manipulation and Hygiene: Standing;Sit on 3-in-1 or toilet (clothing-standing and hygiene-sitting) Equipment Used: Gait  belt;Rolling walker Transfers/Ambulation Related to ADLs: Supervision for ambulation. Minguard to stand as pt tends to lean forwards. Cues to stand up tall. Supervision once standing to ambulate. ADL Comments: Min A for LB ADLs. Educated pt to use reacher to help pull pants up as they dropped to floor.     OT Diagnosis:    OT Problem List:   OT Treatment Interventions:     OT Goals Acute Rehab OT Goals OT Goal Formulation: With patient Time For Goal Achievement: 12/05/12 Potential to Achieve Goals: Good ADL Goals Pt Will Perform Lower Body Bathing: with supervision;with caregiver independent in assisting;Sit to stand from chair;Unsupported;with adaptive equipment ADL Goal: Lower Body Bathing - Progress: Progressing toward goals Pt Will Perform Lower Body Dressing: with supervision;with caregiver independent in assisting;Sit to stand from chair;Unsupported;with adaptive equipment ADL Goal: Lower Body Dressing - Progress: Progressing toward goals Pt Will Transfer to Toilet: with supervision;with DME;3-in-1;Drop arm 3-in-1 ADL Goal: Toilet Transfer - Progress: Progressing toward goals Pt Will Perform Toileting - Clothing Manipulation: with modified independence;Sitting on 3-in-1 or toilet ADL Goal: Toileting - Clothing Manipulation - Progress: Progressing toward goals Pt Will Perform Tub/Shower Transfer: with min assist;with caregiver independent in assisting;with DME;Shower seat without back ADL Goal: Web designer - Progress: Discontinued (comment)  Visit Information  Last OT Received On: 11/27/12 Assistance Needed: +1    Subjective Data      Prior Functioning       Cognition  Cognition Arousal/Alertness: Awake/alert Behavior During Therapy: WFL for tasks assessed/performed Overall Cognitive Status: Impaired/Different from baseline Area of Impairment: Safety/judgement General Comments: Pt attempted to stand without walker and OT stressed importance of standing with  walker for safety.     Mobility  Bed Mobility Bed Mobility: Supine to Sit;Sitting - Scoot to Edge of Bed Supine to Sit: 6: Modified independent (Device/Increase time) Sitting - Scoot to Edge of Bed: 6: Modified independent (Device/Increase time) Details for Bed Mobility Assistance: No cues needed.   Transfers Transfers: Sit to Stand;Stand to Sit Sit to Stand: 4: Min guard;From chair/3-in-1; From bed; With upper extremity assist Stand to Sit: 4: Supervision;With upper extremity assist;With armrests;To chair/3-in-1 Details for Transfer Assistance: Cues needed for pt to stand up tall as she was leaning forwards upon standing.     Exercises  Total Joint Exercises Quad Sets: AROM;Right;10 reps Long Arc Quad: AROM;Right;10 reps   Balance     End of Session OT - End of Session Equipment Utilized During Treatment: Gait belt Activity Tolerance: Patient tolerated treatment well Patient left: in chair;Other (comment) (left with PT)  GO     Earlie Raveling OTR/L [phone] 11/27/2012, 10:45 AM

## 2012-11-27 NOTE — Telephone Encounter (Signed)
I called and I spoke with the patient about transition of care appointment. I ask the patient how she was doing and was she feeling better. Patient states that she is doing better. I also ask about any changes in her medications and she states that she can't remember so I told her that it was okay that she can go over this at her follow up appointment. She was unable to schedule the appointment. She will have to call back once she figures out who will bring her to the appointment. CLS

## 2012-11-27 NOTE — Progress Notes (Signed)
Seen and agreed 11/27/2012 Robinette, Julia Elizabeth PTA [phone] pager [phone] office    

## 2012-11-27 NOTE — Progress Notes (Signed)
[name]  MRN: [id number] DOB/Age: [birth year] 57 y.o. Physician: Lynnea Maizes, M.D. 7 Days Post-Op Procedure(s) (LRB): right hip trochanteric nail (Right)  Subjective: Comfortable, anxious to go home Vital Signs Temp:  [98.2 F (36.8 C)-98.6 F (37 C)] 98.2 F (36.8 C) (04/21 0622) Pulse Rate:  [82-91] 82 (04/21 0622) Resp:  [16-18] 16 (04/21 0622) BP: (101-136)/(66-80) 116/70 mmHg (04/21 0622) SpO2:  [91 %-100 %] 100 % (04/21 0622)  Lab Results No results found for this basename: WBC, HGB, HCT, PLT,  in the last 72 hours BMET  Recent Labs  11/27/12 0730  CREATININE 0.53   INR  Date Value Range Status  08/05/2012 1.19  0.00 - 1.49 Final     Exam  Hip benign. N/V intact  Plan D/c today, f/u my office 2-3 weeks  [name] M 11/27/2012, 1:08 PM

## 2012-11-27 NOTE — Discharge Summary (Signed)
Physician Discharge Summary  [name] EAV:[id number] DOB: Jul 09, 1956 DOA: 11/19/2012  PCP: Ernst Breach, PA-C  Admit date: 11/19/2012 Discharge date: 11/27/2012  Time spent: 35 minutes  Recommendations for Outpatient Follow-up:  1. Patient needs to be on aspirin at least for 6 weeks subsequent to hip repair  2. Followup vitamin D. levels drawn in hospital 3. Patient may need increase in pain medication regimen 4. Repeat CBC in about a week 5. Patient is full weight bearing   Discharge Diagnoses:  Principal Problem:   Hip fracture, right Active Problems:   Rheumatoid arthritis   Chronic use of steroids   Discharge Condition: Good  Diet recommendation: Heart healthy  Filed Weights   11/25/12 1300  Weight: 74.39 kg (164 lb)    History of present illness:  57 year old female admitted 11/19/2012 with a right hip fracture subsequent to accidental fall while playing soccer.  Patient was found to have an intraventricular fracture and underwent repair as per  Hospital Course:  1. RA: stable. Continue prednisone 10mg  and follow up with rheumatologist Dr. Azzie Roup at discharge 2. Right hip fracture: per ortho service. S/P right hip trochanteric nail placement 4/15. Continue Oxycodone 10 mg q 4 prn-Added Oxycontin 10 Q12-she may need an increase in her long-acting pain medications. Would suggest using Tylenol/motrin as first choice practice medicating with opiates.  Will need SNF. Anticoagulation is ASA 325 daily.SHe was not eligible for SNF as she couldn't afford the same nor hospital bed.  She will have Pt/OT and an RN at d/c to follow her at home for further needs  I have filled out multiple forms for her being out of work and have provided her with a letter regarding the same as well 3. Asthma: seasonal. Well control. No SOB and no wheezing currently. 4. GERD: continue PPI. 5. Chronic use of steroids: consider biphosphonate prescription and Dexa scan on follow-up.  Vit D level pending-will continue oscal   Procedures:  R hip intertrochanteric nail placed 4.15  Consultations:  Orthopedics  Discharge Exam: Filed Vitals:   11/26/12 0520 11/26/12 1645 11/26/12 2215 11/27/12 0622  BP: 113/62 101/66 136/80 116/70  Pulse: 80 91 84 82  Temp: 98 F (36.7 C) 98.6 F (37 C) 98.2 F (36.8 C) 98.2 F (36.8 C)  TempSrc:  Oral    Resp: 18 18 18 16   Height:      Weight:      SpO2: 100% 91% 100% 100%   Alert.  Ambulated from bathroom to bed withminimal diffculty  General: pleasant, oriented Cardiovascular:  s1 s2 no m/r/g Respiratory: clear  Discharge Instructions  Discharge Orders   Future Appointments Provider Department Dept Phone   12/08/2012 10:00 AM [facility] [phone]   12/08/2012 11:00 AM Nyoka Cowden, MD [facility] [phone]   Future Orders Complete By Expires     Diet - low sodium heart healthy  As directed     Increase activity slowly  As directed     Weight bearing as tolerated  As directed     Weight bearing as tolerated  As directed         Medication List    STOP taking these medications       traMADol 50 MG tablet  Commonly known as:  ULTRAM      TAKE these medications       aspirin EC 325 MG tablet  Take 1 tablet (325 mg total) by mouth daily.  bisacodyl 10 MG suppository  Commonly known as:  DULCOLAX  Place 1 suppository (10 mg total) rectally daily as needed.     calcium-vitamin D 500-200 MG-UNIT per tablet  Commonly known as:  OSCAL WITH D  Take 2 tablets by mouth 2 (two) times daily.     folic acid 1 MG tablet  Commonly known as:  FOLVITE  Take 1 mg by mouth daily.     HYDROmorphone 2 MG tablet  Commonly known as:  DILAUDID  Take 1-2 tablets (2-4 mg total) by mouth every 4 (four) hours as needed for pain.     methocarbamol 500 MG tablet  Commonly known as:  ROBAXIN  Take 1 tablet (500 mg total) by mouth every 6 (six) hours as needed.      Oxycodone HCl 10 MG Tabs  Take 1 tablet (10 mg total) by mouth every 4 (four) hours as needed.     OxyCODONE 10 mg T12a  Commonly known as:  OXYCONTIN  Take 1 tablet (10 mg total) by mouth every 12 (twelve) hours.     pantoprazole 40 MG tablet  Commonly known as:  PROTONIX  Take 1 tablet (40 mg total) by mouth daily. Take 30-60 min before first meal of the day     potassium chloride SA 20 MEQ tablet  Commonly known as:  K-DUR,KLOR-CON  Take 1 tablet (20 mEq total) by mouth daily.     predniSONE 10 MG tablet  Commonly known as:  DELTASONE  Take 10 mg by mouth daily.     PRESCRIPTION MEDICATION  Receives weekly injection for arthritis at patient's doctor's office         Follow-up Information   Follow up with Senaida Lange, MD In 2 weeks.   Contact information:   [address], [address], [address] [place] [address] [phone]       Follow up with Ernst Breach, PA-C In 1 week.   Contact information:   [address] [place] [address] [phone]         The results of significant diagnostics from this hospitalization (including imaging, microbiology, ancillary and laboratory) are listed below for reference.    Significant Diagnostic Studies: Dg Chest 1 View  11/19/2012  *RADIOLOGY REPORT*  Clinical Data: Fall.  Hip pain.  CHEST - 1 VIEW  Comparison: PA and lateral chest 11/07/2012.  Findings: Again seen is cardiomegaly without edema.  No pneumothorax or pleural effusion. Remote healed rib fractures are noted.  IMPRESSION: No acute disease.   Original Report Authenticated By: Holley Dexter, M.D.    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  No focal consolidation. No pleural effusion or pneumothorax.  Cardiomediastinal silhouette is within normal  limits.  Mild degenerative changes of the visualized thoracolumbar spine.  IMPRESSION: No evidence of acute cardiopulmonary disease.   Original Report Authenticated By: Charline Bills, M.D.    Dg Hip Complete Right  11/19/2012  *RADIOLOGY REPORT*  Clinical Data: Fall, right hip pain and bruising.  RIGHT HIP - COMPLETE 2+ VIEW  Comparison: None.  Findings: The patient has an acute right intertrochanteric fracture.  No other focal bony or joint abnormality is identified.  IMPRESSION: Acute right intertrochanteric fracture.   Original Report Authenticated By: Holley Dexter, M.D.    Dg Hip Operative Right  11/20/2012  *RADIOLOGY REPORT*  Clinical Data: Right femur fracture.  DG OPERATIVE RIGHT HIP  Comparison: Radiographs dated 11/19/2012  Findings: AP and lateral C-arm images demonstrate the patient has undergone open reduction and internal fixation of the intertrochanteric fracture of the proximal right femur.  Alignment and position of the fracture fragments is near anatomic. Compression screw and intramedullary rod are in place with fixation screw.  IMPRESSION: Open reduction and internal fixation of the right femoral fracture.   Original Report Authenticated By: Francene Boyers, M.D.     Microbiology: Recent Results (from the past 240 hour(s))  MRSA PCR SCREENING     Status: None   Collection Time    11/20/12 12:37 AM      Result Value Range Status   MRSA by PCR NEGATIVE  NEGATIVE Final   Comment:            The GeneXpert MRSA Assay (FDA     approved for NASAL specimens     only), is one component of a     comprehensive MRSA colonization     surveillance program. It is not     intended to diagnose MRSA     infection nor to guide or     monitor treatment for     MRSA infections.     Labs: Basic Metabolic Panel:  Recent Labs Lab 11/20/12 2156 11/21/12 0558 11/27/12 0730  NA  --  139  --   K  --  3.9  --   CL  --  103  --   CO2  --  29  --   GLUCOSE  --  108*  --   BUN  --   9  --   CREATININE 0.49* 0.48* 0.53  CALCIUM  --  9.4  --    Liver Function Tests: No results found for this basename: AST, ALT, ALKPHOS, BILITOT, PROT, ALBUMIN,  in the last 168 hours No results found for this basename: LIPASE, AMYLASE,  in the last 168 hours No results found for this basename: AMMONIA,  in the last 168 hours CBC:  Recent Labs Lab 11/20/12 2156 11/21/12 0558 11/22/12 0600  WBC 9.9 8.8 10.5  HGB 11.6* 11.7* 11.4*  HCT 34.7* 34.8* 34.1*  MCV 84.8 85.1 84.8  PLT 189 179 196   Cardiac Enzymes: No results found for this basename: CKTOTAL, CKMB, CKMBINDEX, TROPONINI,  in the last 168 hours BNP: BNP (last 3 results)  Recent Labs  08/05/12 1340 08/07/12 1357  PROBNP 804.8* 111.0*   CBG: No results found for this basename: GLUCAP,  in the last 168 hours     Signed:  Rhetta Mura  [facility] 11/27/2012, 9:49 AM

## 2012-11-27 NOTE — Progress Notes (Signed)
Patient discharged in stable condition via wheelchair to home. Discharge instructions and prescriptions were given and explained. 

## 2012-11-27 NOTE — Progress Notes (Signed)
Physical Therapy Treatment Patient Details Name: [name] MRN: [id number] DOB: 10-27-1955 Today's Date: 11/27/2012 Time: 1027-2536 PT Time Calculation (min): 30 min  PT Assessment / Plan / Recommendation Comments on Treatment Session  Pain was 8/10 with mobility reports that she just had pain meds.  Patient requested more pain meds at the end of treatment. Patient unable to complete stairs today.  Then states that she is getting a wheelchair and her caregiver is going to get her up the 2 steps in front of apt using w/c.   HHPT will continue to work with patient on stairs.    Follow Up Recommendations  Home health PT;Supervision/Assistance - 24 hour     Does the patient have the potential to tolerate intense rehabilitation     Barriers to Discharge        Equipment Recommendations       Recommendations for Other Services    Frequency Min 5X/week   Plan Discharge plan remains appropriate;Frequency remains appropriate;Equipment recommendations need to be updated    Precautions / Restrictions Precautions Precautions: Fall Restrictions Weight Bearing Restrictions: Yes RLE Weight Bearing: Weight bearing as tolerated   Pertinent Vitals/Pain 8/10 R Hip Pain    Mobility  Bed Mobility Bed Mobility: Supine to Sit;Sitting - Scoot to Edge of Bed Supine to Sit: 6: Modified independent (Device/Increase time) Sitting - Scoot to Edge of Bed: 6: Modified independent (Device/Increase time) Details for Bed Mobility Assistance: No cues needed.  Assist for safety only. Transfers Transfers: Sit to Stand;Stand to Sit Sit to Stand: 4: Min guard;From chair/3-in-1;With upper extremity assist;With armrests Stand to Sit: 4: Min guard;With upper extremity assist;With armrests;To chair/3-in-1 Details for Transfer Assistance: Cues for patient to wait for walker and SPTA. Ambulation/Gait Ambulation/Gait Assistance: 6: Modified independent (Device/Increase time) Ambulation Distance (Feet): 200  Feet Assistive device: Rolling walker Ambulation/Gait Assistance Details: Patient reports significant pain with ambulation.  Cues to shift weight through LE.  Called RN for pain meds. Gait Pattern: Step-through pattern;Decreased stance time - right;Decreased step length - left;Antalgic Gait velocity: decreased Stairs: No Stairs Assistance Details (indicate cue type and reason): Patient states she will not do stair training.  Patient states that they will use a W/C to roll up stairs to house.    Exercises Total Joint Exercises Quad Sets: AROM;Right;10 reps Long Arc Quad: AROM;Right;10 reps   PT Diagnosis:    PT Problem List:   PT Treatment Interventions:     PT Goals Acute Rehab PT Goals PT Goal: Ambulate - Progress: Progressing toward goal PT Goal: Up/Down Stairs - Progress: Not progressing PT Goal: Perform Home Exercise Program - Progress: Progressing toward goal  Visit Information  Last PT Received On: 11/27/12 Assistance Needed: +1    Subjective Data      Cognition  Cognition Arousal/Alertness: Awake/alert Behavior During Therapy: WFL for tasks assessed/performed Overall Cognitive Status: Impaired/Different from baseline Area of Impairment: Safety/judgement General Comments: Pt attempted to stand without walker and OT stressed importance of standing with walker for safety.     Balance     End of Session PT - End of Session Equipment Utilized During Treatment: Gait belt Activity Tolerance: Patient limited by pain Patient left: in chair;with call bell/phone within reach;with nursing in room Nurse Communication: Patient requests pain meds   GP     [name] 11/27/2012, 9:31 AM

## 2012-12-05 ENCOUNTER — Inpatient Hospital Stay: Payer: Self-pay | Admitting: Medical

## 2012-12-08 ENCOUNTER — Ambulatory Visit: Payer: 59 | Admitting: Internal Medicine

## 2012-12-13 ENCOUNTER — Inpatient Hospital Stay: Payer: Self-pay | Admitting: Medical

## 2013-06-21 IMAGING — CR DG CHEST 2V
2 series · 2 of 2 positions shown · non-contrast
Comparison: 08/05/2012

CLINICAL DATA: Shortness of breath, cough

CHEST - 2 VIEW

[view not recorded (1 of 2)]
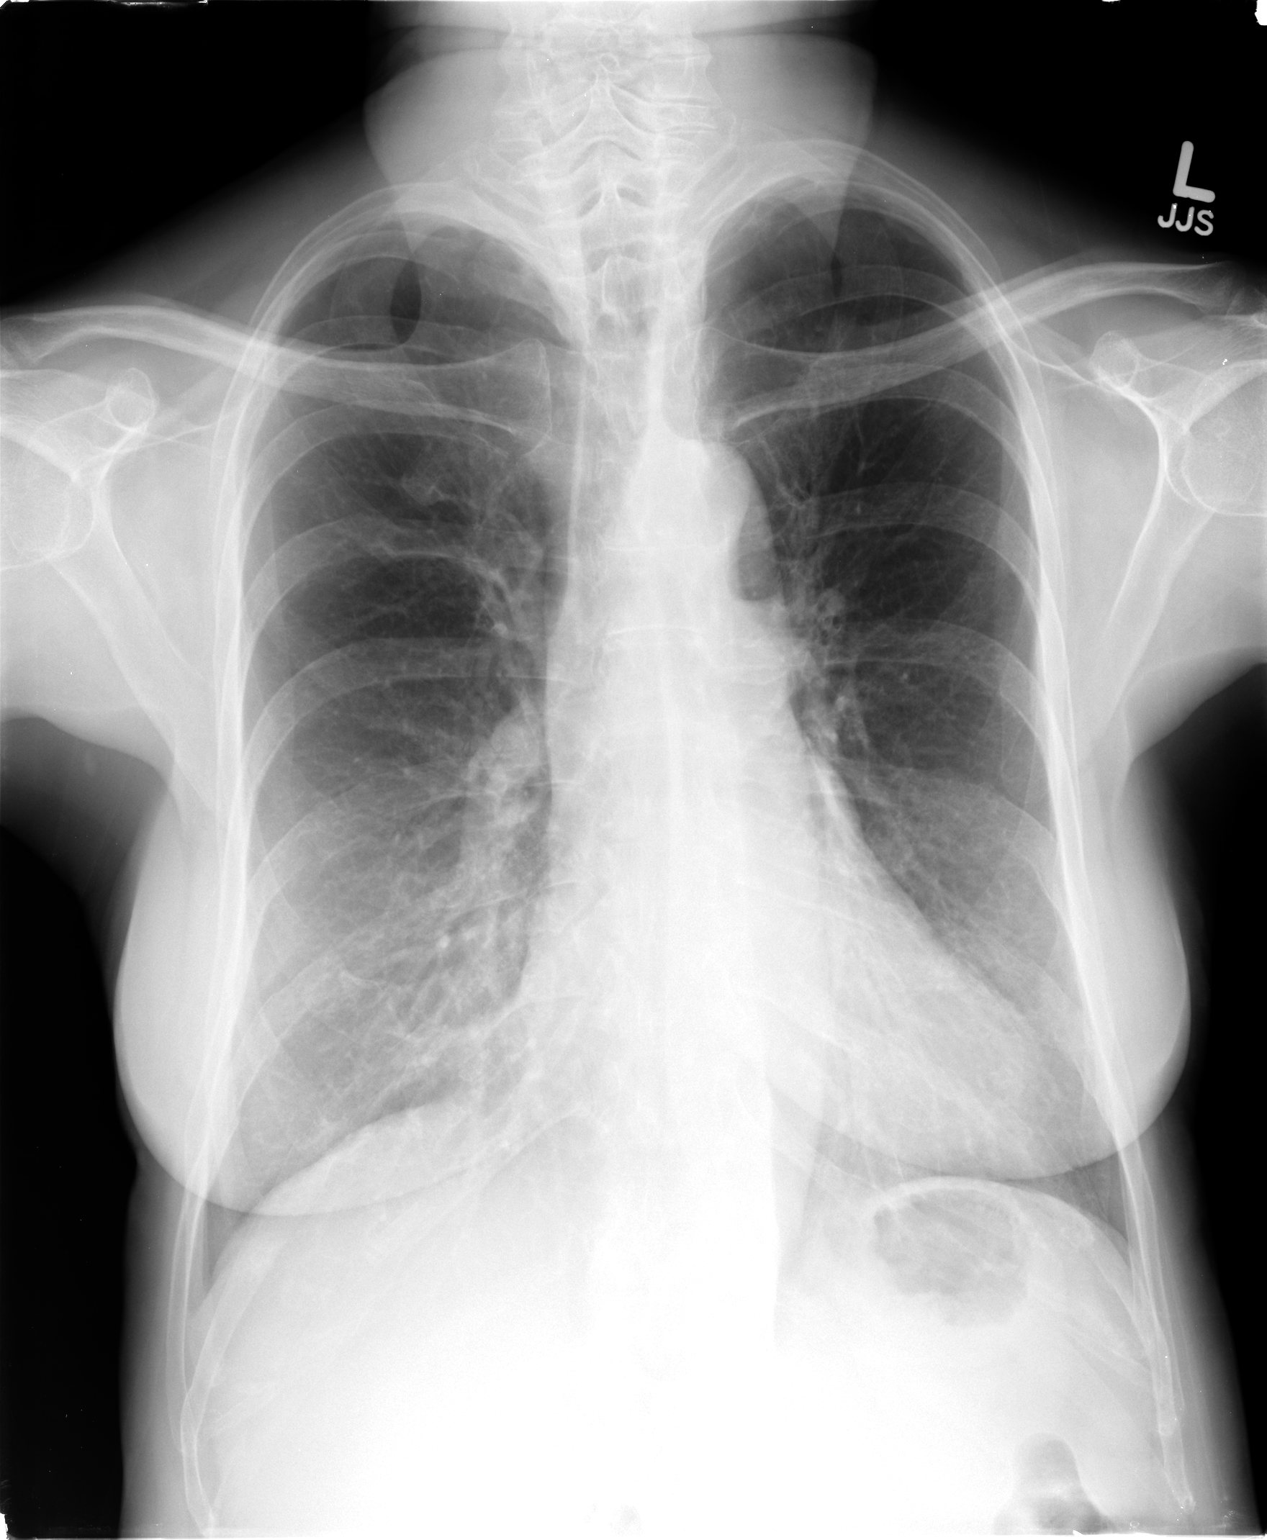

[view not recorded (2 of 2)]
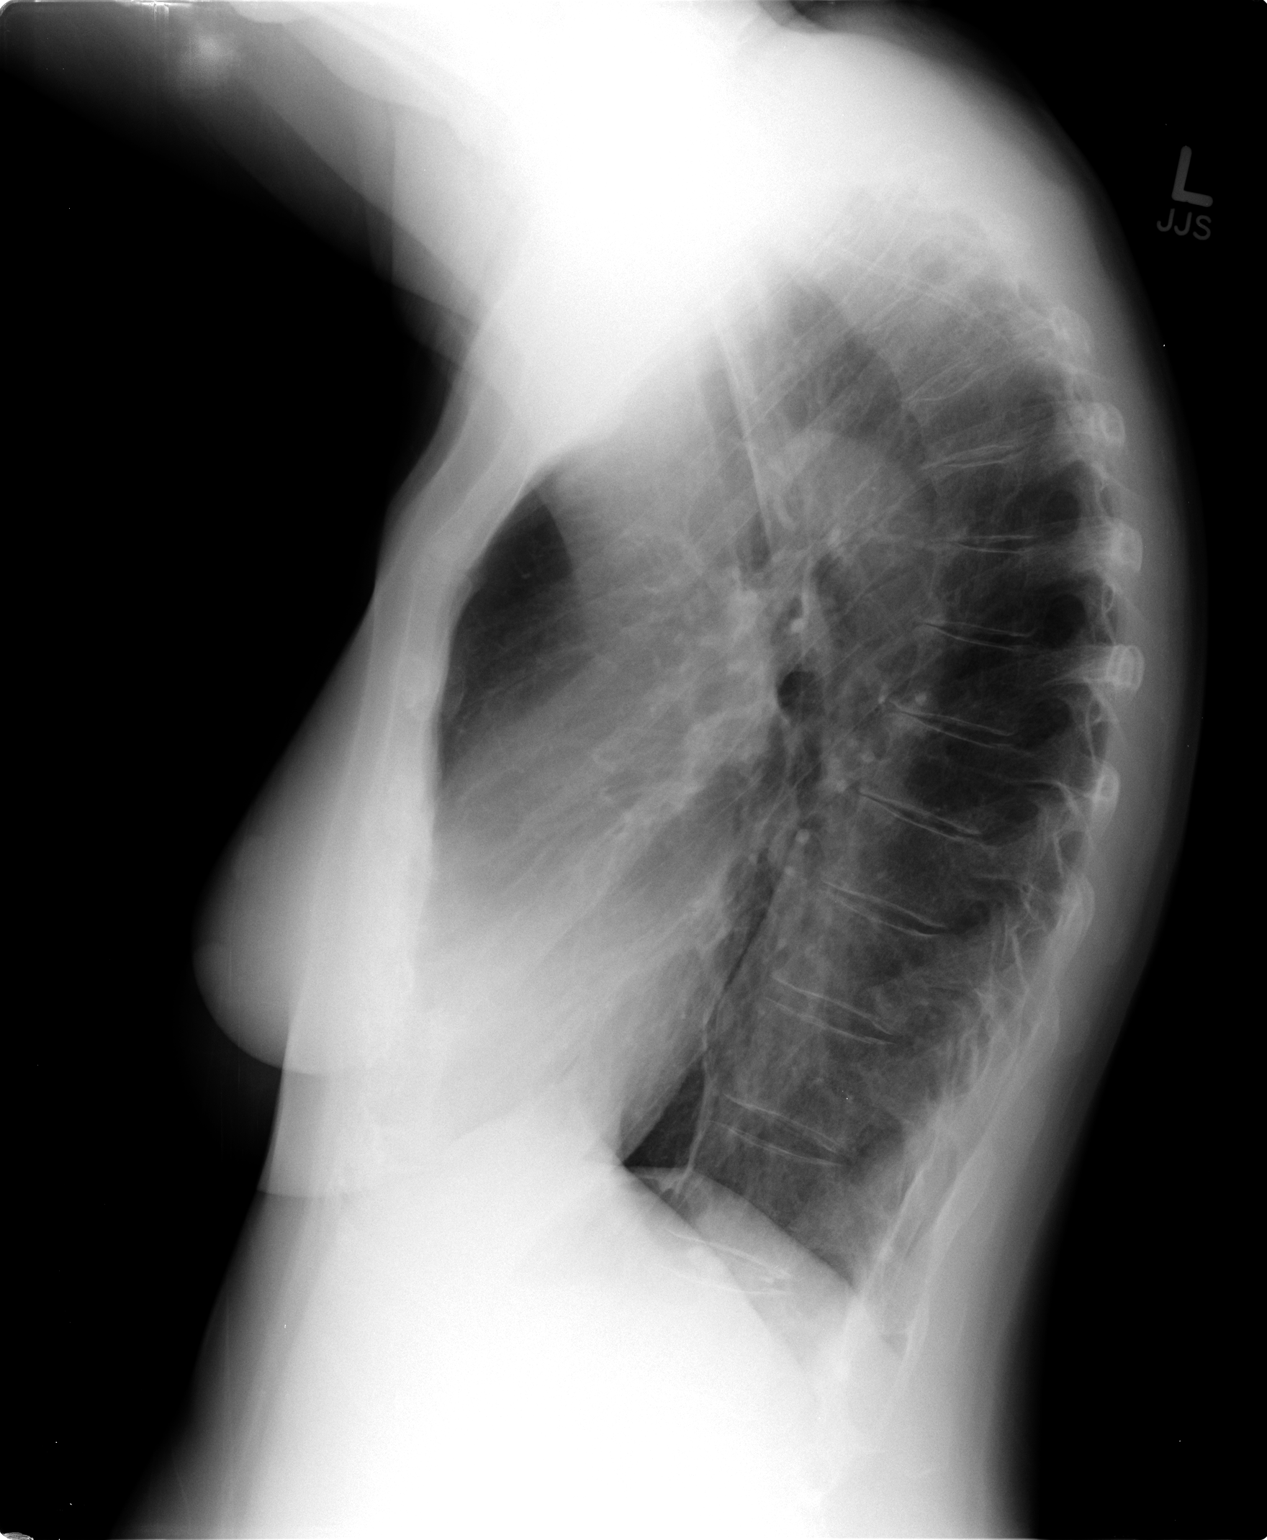

[2 of 2 positions shown; findings below may reference images not displayed]

FINDINGS: Chronic interstitial markings/emphysematous changes with
hyperinflation.  No focal consolidation. No pleural effusion or
pneumothorax.

Cardiomediastinal silhouette is within normal limits.

Mild degenerative changes of the visualized thoracolumbar spine.
IMPRESSION: No evidence of acute cardiopulmonary disease.

## 2013-07-03 IMAGING — CR DG CHEST 1V
1 series · 1 of 1 positions shown · non-contrast
Comparison: PA and lateral chest 11/07/2012.

CLINICAL DATA: Fall.  Hip pain.

CHEST - 1 VIEW

[t chest supine]
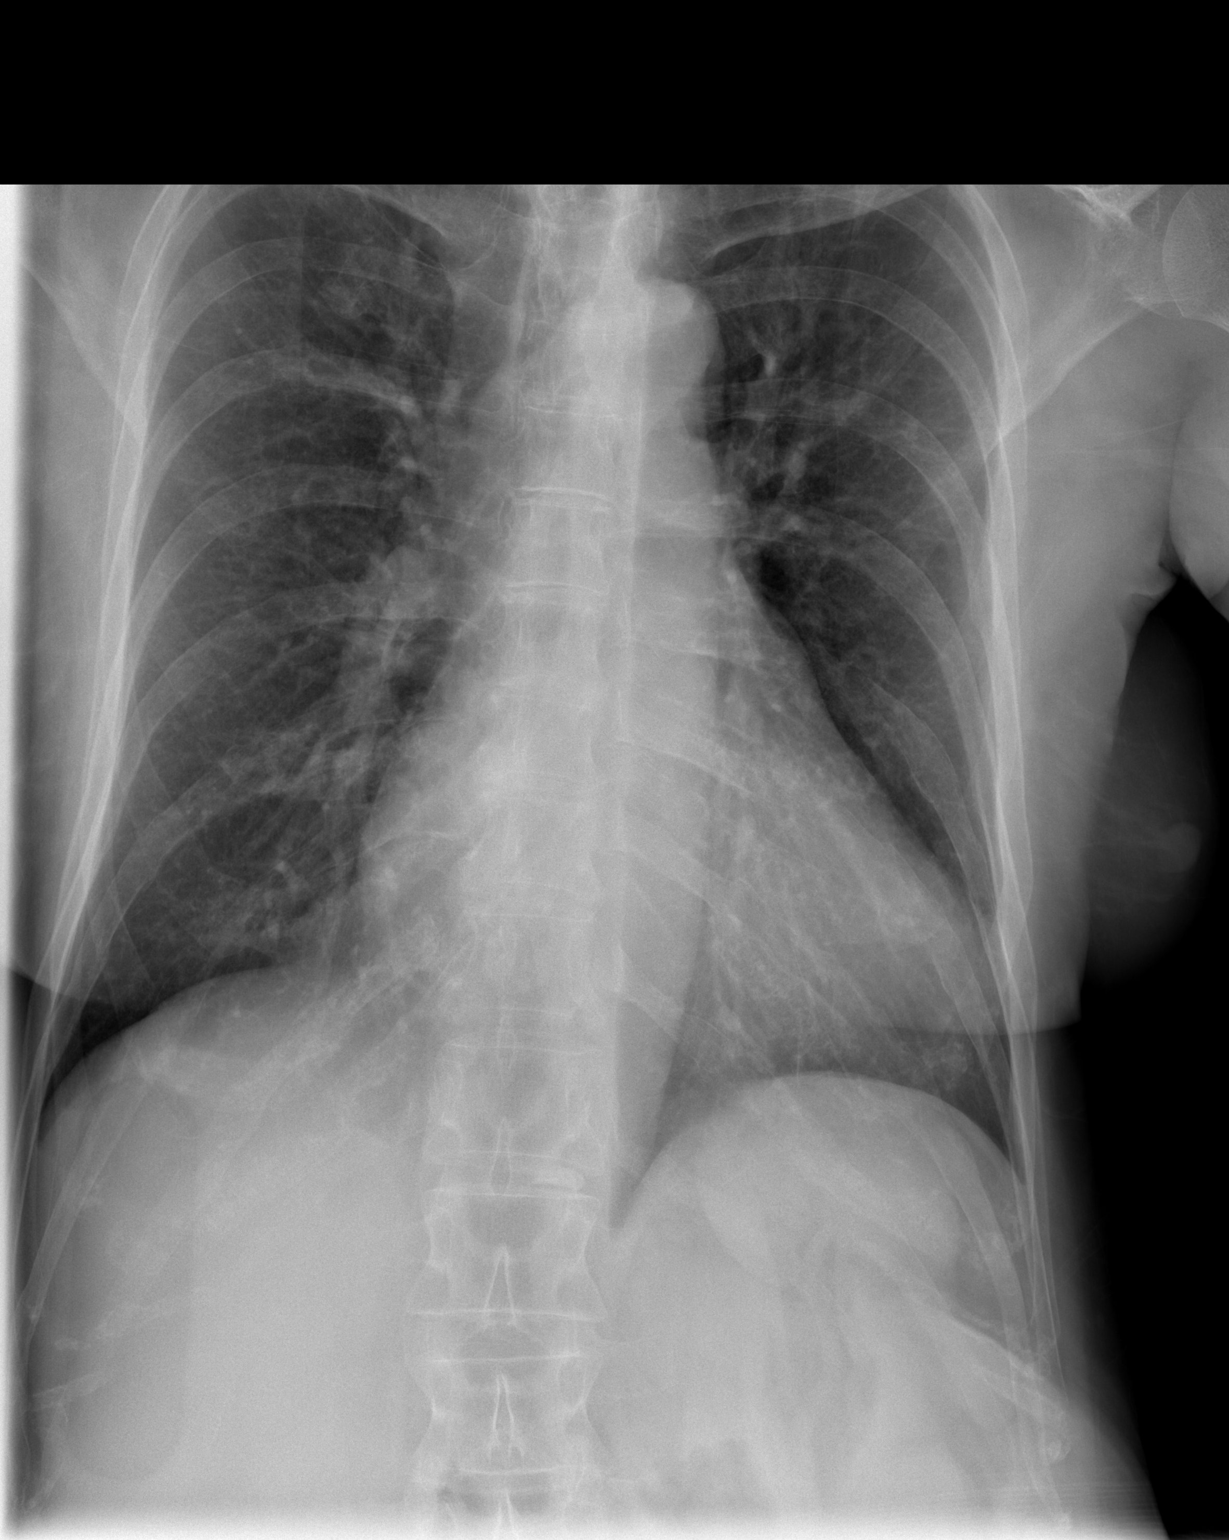

[1 of 1 positions shown; findings below may reference images not displayed]

FINDINGS: Again seen is cardiomegaly without edema.  No
pneumothorax or pleural effusion. Remote healed rib fractures are
noted.
IMPRESSION: No acute disease.

## 2014-10-03 ENCOUNTER — Encounter: Payer: Self-pay | Admitting: Medical

## 2014-10-03 ENCOUNTER — Telehealth: Payer: Self-pay | Admitting: Medical

## 2014-10-03 NOTE — Telephone Encounter (Signed)
Recommend office visit, last seen 2014   Note for [name] Last seen had hip fracture, in hospital had abnormal EKG, anemia, need for workup for pathological fracture.

## 2014-10-03 NOTE — Telephone Encounter (Signed)
Vincenza HewsShane this pt's contact numbers are no longer valid.  I have sent this patient a letter asking her to call the office to set something up.

## 2014-10-16 ENCOUNTER — Telehealth: Payer: Self-pay | Admitting: Medical

## 2014-10-16 NOTE — Telephone Encounter (Signed)
Called job & pt is no longer employed.  Recv'd returned mail, pt is no longer at that address
